# Patient Record
Sex: Male | Born: 1970
Health system: Southern US, Community
[De-identification: ages and names within clinical notes are randomized; demographics above are authoritative.]

## PROBLEM LIST (undated history)

## (undated) DIAGNOSIS — M1990 Unspecified osteoarthritis, unspecified site: Secondary | ICD-10-CM

## (undated) DIAGNOSIS — R7303 Prediabetes: Secondary | ICD-10-CM

## (undated) DIAGNOSIS — F419 Anxiety disorder, unspecified: Secondary | ICD-10-CM

## (undated) DIAGNOSIS — E785 Hyperlipidemia, unspecified: Secondary | ICD-10-CM

## (undated) DIAGNOSIS — K219 Gastro-esophageal reflux disease without esophagitis: Secondary | ICD-10-CM

## (undated) DIAGNOSIS — I10 Essential (primary) hypertension: Secondary | ICD-10-CM

## (undated) HISTORY — DX: Gastro-esophageal reflux disease without esophagitis: K21.9

## (undated) HISTORY — DX: Hyperlipidemia, unspecified: E78.5

## (undated) HISTORY — DX: Essential (primary) hypertension: I10

## (undated) HISTORY — DX: Anxiety disorder, unspecified: F41.9

## (undated) HISTORY — DX: Unspecified osteoarthritis, unspecified site: M19.90

---

## 2004-03-08 ENCOUNTER — Other Ambulatory Visit: Payer: Self-pay

## 2005-04-08 ENCOUNTER — Emergency Department: Payer: Self-pay | Admitting: Emergency Medicine

## 2006-05-03 ENCOUNTER — Ambulatory Visit: Payer: Self-pay | Admitting: Unknown Physician Specialty

## 2006-05-09 ENCOUNTER — Ambulatory Visit: Payer: Self-pay | Admitting: Unknown Physician Specialty

## 2006-07-21 ENCOUNTER — Ambulatory Visit: Payer: Self-pay | Admitting: Psychiatry

## 2007-11-12 ENCOUNTER — Emergency Department: Payer: Self-pay | Admitting: Emergency Medicine

## 2008-04-27 ENCOUNTER — Ambulatory Visit: Payer: Self-pay | Admitting: Family Medicine

## 2008-07-09 DIAGNOSIS — I10 Essential (primary) hypertension: Secondary | ICD-10-CM | POA: Insufficient documentation

## 2008-09-10 DIAGNOSIS — E782 Mixed hyperlipidemia: Secondary | ICD-10-CM | POA: Insufficient documentation

## 2011-04-05 ENCOUNTER — Emergency Department: Payer: Self-pay | Admitting: Unknown Physician Specialty

## 2011-06-12 ENCOUNTER — Emergency Department: Payer: Self-pay | Admitting: Emergency Medicine

## 2014-03-27 ENCOUNTER — Ambulatory Visit: Payer: Self-pay | Admitting: Unknown Physician Specialty

## 2014-08-06 ENCOUNTER — Emergency Department: Payer: Self-pay | Admitting: Emergency Medicine

## 2014-08-06 LAB — CBC
HCT: 46 % (ref 40.0–52.0)
HGB: 15.2 g/dL (ref 13.0–18.0)
MCH: 29.1 pg (ref 26.0–34.0)
MCHC: 32.9 g/dL (ref 32.0–36.0)
MCV: 88 fL (ref 80–100)
Platelet: 267 10*3/uL (ref 150–440)
RBC: 5.22 10*6/uL (ref 4.40–5.90)
RDW: 13.5 % (ref 11.5–14.5)
WBC: 11.8 10*3/uL — AB (ref 3.8–10.6)

## 2014-08-06 LAB — BASIC METABOLIC PANEL
Anion Gap: 10 (ref 7–16)
BUN: 15 mg/dL (ref 7–18)
CHLORIDE: 107 mmol/L (ref 98–107)
CREATININE: 0.92 mg/dL (ref 0.60–1.30)
Calcium, Total: 8.8 mg/dL (ref 8.5–10.1)
Co2: 24 mmol/L (ref 21–32)
EGFR (Non-African Amer.): >60
Glucose: 92 mg/dL (ref 65–99)
OSMOLALITY: 282 (ref 275–301)
POTASSIUM: 3.7 mmol/L (ref 3.5–5.1)
Sodium: 141 mmol/L (ref 136–145)

## 2014-08-06 LAB — TROPONIN I: Troponin-I: <0.02 ng/mL

## 2014-11-25 ENCOUNTER — Emergency Department: Payer: Self-pay | Admitting: Emergency Medicine

## 2014-12-01 ENCOUNTER — Ambulatory Visit: Payer: Self-pay | Admitting: Unknown Physician Specialty

## 2014-12-02 ENCOUNTER — Emergency Department: Payer: Self-pay | Admitting: Emergency Medicine

## 2014-12-03 DIAGNOSIS — F411 Generalized anxiety disorder: Secondary | ICD-10-CM | POA: Insufficient documentation

## 2014-12-08 ENCOUNTER — Emergency Department (HOSPITAL_COMMUNITY)
Admission: EM | Admit: 2014-12-08 | Discharge: 2014-12-08 | Disposition: A | Discharge status: Home / Self Care | Payer: PRIVATE HEALTH INSURANCE | Attending: Emergency Medicine | Admitting: Emergency Medicine

## 2014-12-08 ENCOUNTER — Emergency Department (HOSPITAL_COMMUNITY): Payer: PRIVATE HEALTH INSURANCE

## 2014-12-08 DIAGNOSIS — R5383 Other fatigue: Secondary | ICD-10-CM | POA: Insufficient documentation

## 2014-12-08 DIAGNOSIS — R112 Nausea with vomiting, unspecified: Secondary | ICD-10-CM | POA: Insufficient documentation

## 2014-12-08 DIAGNOSIS — R072 Precordial pain: Secondary | ICD-10-CM | POA: Insufficient documentation

## 2014-12-08 DIAGNOSIS — R079 Chest pain, unspecified: Secondary | ICD-10-CM | POA: Diagnosis present

## 2014-12-08 DIAGNOSIS — R002 Palpitations: Secondary | ICD-10-CM | POA: Insufficient documentation

## 2014-12-08 LAB — CBC
HCT: 48.9 % (ref 39.0–52.0)
Hemoglobin: 16.9 g/dL (ref 13.0–17.0)
MCH: 29.9 pg (ref 26.0–34.0)
MCHC: 34.6 g/dL (ref 30.0–36.0)
MCV: 86.5 fL (ref 78.0–100.0)
Platelets: 300 10*3/uL (ref 150–400)
RBC: 5.65 MIL/uL (ref 4.22–5.81)
RDW: 13.4 % (ref 11.5–15.5)
WBC: 10.1 10*3/uL (ref 4.0–10.5)

## 2014-12-08 LAB — I-STAT TROPONIN, ED: Troponin i, poc: 0 ng/mL (ref 0.00–0.08)

## 2014-12-08 LAB — BASIC METABOLIC PANEL
Anion gap: 8 (ref 5–15)
BUN: 12 mg/dL (ref 6–23)
CO2: 26 mmol/L (ref 19–32)
CREATININE: 1.03 mg/dL (ref 0.50–1.35)
Calcium: 9.5 mg/dL (ref 8.4–10.5)
Chloride: 106 mmol/L (ref 96–112)
GFR calc non Af Amer: 86 mL/min — ABNORMAL LOW (ref >90)
GLUCOSE: 82 mg/dL (ref 70–99)
POTASSIUM: 4.1 mmol/L (ref 3.5–5.1)
Sodium: 140 mmol/L (ref 135–145)

## 2014-12-08 LAB — TSH: TSH: 2.493 u[IU]/mL (ref 0.350–4.500)

## 2014-12-08 MED ORDER — GI COCKTAIL ~~LOC~~
30.0000 mL | Freq: Once | ORAL | Status: DC
  Filled 2014-12-08: qty 30

## 2014-12-08 NOTE — ED Notes (Signed)
Pt verbailized understandintg of d/c instructions and follow up with GI on Thursday, no further questions.

## 2014-12-08 NOTE — ED Notes (Signed)
PA in Room

## 2014-12-08 NOTE — ED Provider Notes (Signed)
CSN: [id number]     Arrival date & time 12/08/14  1243 History   First MD Initiated Contact with Patient 12/08/14 1303     Chief Complaint  Patient presents with  . Chest Pain     (Consider location/radiation/quality/duration/timing/severity/associated sxs/prior Treatment) Patient is a 44 y.o. male presenting with chest pain. The history is provided by the patient and medical records.  Chest Pain Associated symptoms: fatigue, nausea, palpitations and vomiting     This is a 44 year old male with no significant past medical history presenting to the ED for 3 months of ongoing nausea, vomiting, chest pain, palpitations, and sensation of coldness. Patient states over the past 2 weeks he has been evaluated twice at [facility] in [place]. He states he has had numerous blood tests, abdominal ultrasound, CT of his abdomen and pelvis, chest x-rays, HIDA scan-- all without known cause of his symptoms. He states he was started on medications for GERD, but has not taken them today. He notes some weight loss which she thinks is secondary to his persistent nausea and vomiting.  Also notes some fatigue, states "energy is not where it needs to be".  He feels that he has a hormone imbalance and would like this checked.  Patient has no known cardiac hx.  His father had CAD.  He is a daily smoker, 1PPD for the past 15 years.  VSS on arrival.  No past medical history on file. No past surgical history on file. No family history on file. History  Substance Use Topics  . Smoking status: Not on file  . Smokeless tobacco: Not on file  . Alcohol Use: Not on file    Review of Systems  Constitutional: Positive for fatigue.  Cardiovascular: Positive for chest pain and palpitations.  Gastrointestinal: Positive for nausea and vomiting.  All other systems reviewed and are negative.     Allergies  Codeine and Other  Home Medications   Prior to Admission medications    Not on File   BP 124/43 mmHg  Pulse 54  Temp(Src) 97.8 F (36.6 C) (Oral)  Resp 15  Ht  (1.727 m)  Wt 218 lb (98.884 kg)  BMI 33.15 kg/m2  SpO2 99%   Physical Exam  Constitutional: He is oriented to person, place, and time. He appears well-developed and well-nourished. No distress.  Appears well nourished, not cachectic  HENT:  Head: Normocephalic and atraumatic.  Mouth/Throat: Oropharynx is clear and moist.  Eyes: Conjunctivae and EOM are normal. Pupils are equal, round, and reactive to light.  Neck: Normal range of motion. Neck supple.  Cardiovascular: Normal rate, regular rhythm and normal heart sounds.   Pulmonary/Chest: Effort normal and breath sounds normal. No respiratory distress. He has no wheezes.  Tenderness of mid-sternal and sub-sternal regions; no deformities noted; lungs clear bilaterally  Abdominal: Soft. Bowel sounds are normal. There is no tenderness. There is no guarding.  Musculoskeletal: Normal range of motion. He exhibits no edema.  Neurological: He is alert and oriented to person, place, and time.  Skin: Skin is warm and dry. He is not diaphoretic.  Psychiatric: He has a normal mood and affect.  Nursing note and vitals reviewed.   ED Course  Procedures (including critical care time) Labs Review Labs Reviewed  BASIC METABOLIC PANEL - Abnormal; Notable for the following:    GFR calc non Af Amer 86 (*)    All other components within normal limits  CBC  TSH  I-STAT TROPOININ, ED  Imaging Review Dg Chest 2 View  12/08/2014   CLINICAL DATA:  Chest pain  EXAM: CHEST  2 VIEW  COMPARISON:  11/25/2014  FINDINGS: The heart size and mediastinal contours are within normal limits. Both lungs are clear. The visualized skeletal structures are unremarkable.  IMPRESSION: No active cardiopulmonary disease.   Electronically Signed   By: Alcide CleverMark  Lukens M.D.   On: 12/08/2014 14:40     EKG Interpretation None      MDM   Final diagnoses:  Chest pain,  unspecified chest pain type   44 year old male with 3 months of ongoing chest pain, palpitations, nausea, and vomiting, as well as feeling cold. This is his third ED evaluation in the past 2 weeks, previous evaluations were [facility]. He remains concerned that we are "missing something".  On exam, patient is in no acute distress. He does have noted tenderness of his midsternal and substernal chest without noted deformity. His lungs are clear and he is in no respiratory distress. EKG NSR without acute ischemia.  Will obtain labs including CBC, BMP, TSH, troponin as well as CXR.  GI cocktail ordered however patient declined.  Labwork here including TSH is normal. Chest x-ray is clear. I have requested records from [facility] and reviewed them-- CT, abdominal u/s, HIDA scan, and remainder of lab work is WNL.  I have discussed with patient that i have low suspicion for ACS, PE, dissection, or acute cardiac event given negative work-up today after 1 month of ongoing symptoms. He is dissatisfied that we have been unable to find the answer to his pain. He states "I'm worried about my adrenals". I have discussed with him that his adrenal glands appear normal on CT and there were no noted masses. He is concerned about hormone testing, feel this can be done as an OP as will not change management from the ED today.  He has an upcoming appointment with GI in 2 days-- encouraged to keep this appt as he may need EGD.  Discussed plan with patient, he/she acknowledged understanding and agreed with plan of care.  Return precautions given for new or worsening symptoms.  Garlon HatchetLisa M Leeanne Butters, PA-C 12/08/14 1536  Geoffery Lyonsouglas Delo, MD 12/08/14 [phone]

## 2014-12-08 NOTE — ED Notes (Signed)
Onset 3 months ago nausea emesis chest pain "fluttering" feeling cold all the time. Currently pain 4/10 burning with nausea.

## 2014-12-08 NOTE — Discharge Instructions (Signed)
Follow-up with GI on Thursday as scheduled. Return to the ED for new or worsening symptoms.

## 2014-12-08 NOTE — ED Notes (Signed)
Pt back from X-ray.  

## 2014-12-11 ENCOUNTER — Ambulatory Visit: Payer: Self-pay | Admitting: Unknown Physician Specialty

## 2015-02-01 LAB — SURGICAL PATHOLOGY

## 2015-03-15 ENCOUNTER — Other Ambulatory Visit: Payer: Self-pay | Admitting: Family Medicine

## 2015-04-08 ENCOUNTER — Other Ambulatory Visit: Payer: Self-pay

## 2015-04-08 DIAGNOSIS — K30 Functional dyspepsia: Secondary | ICD-10-CM | POA: Insufficient documentation

## 2015-04-08 DIAGNOSIS — E782 Mixed hyperlipidemia: Secondary | ICD-10-CM | POA: Insufficient documentation

## 2015-04-08 DIAGNOSIS — K760 Fatty (change of) liver, not elsewhere classified: Secondary | ICD-10-CM | POA: Insufficient documentation

## 2015-04-08 DIAGNOSIS — K219 Gastro-esophageal reflux disease without esophagitis: Secondary | ICD-10-CM | POA: Insufficient documentation

## 2015-04-08 DIAGNOSIS — F410 Panic disorder [episodic paroxysmal anxiety] without agoraphobia: Secondary | ICD-10-CM | POA: Insufficient documentation

## 2015-04-09 ENCOUNTER — Ambulatory Visit: Payer: Self-pay | Admitting: Family Medicine

## 2015-04-09 ENCOUNTER — Encounter: Payer: Self-pay | Admitting: Family Medicine

## 2015-04-09 ENCOUNTER — Ambulatory Visit (INDEPENDENT_AMBULATORY_CARE_PROVIDER_SITE_OTHER): Payer: PRIVATE HEALTH INSURANCE | Admitting: Family Medicine

## 2015-04-09 VITALS — BP 126/72 | HR 80 | Temp 98.0°F | Resp 16 | Ht 68.0 in | Wt 232.0 lb

## 2015-04-09 DIAGNOSIS — M25512 Pain in left shoulder: Secondary | ICD-10-CM

## 2015-04-09 DIAGNOSIS — M25511 Pain in right shoulder: Secondary | ICD-10-CM

## 2015-04-09 DIAGNOSIS — F411 Generalized anxiety disorder: Secondary | ICD-10-CM

## 2015-04-09 DIAGNOSIS — K589 Irritable bowel syndrome without diarrhea: Secondary | ICD-10-CM | POA: Insufficient documentation

## 2015-04-09 DIAGNOSIS — E669 Obesity, unspecified: Secondary | ICD-10-CM | POA: Insufficient documentation

## 2015-04-09 DIAGNOSIS — K219 Gastro-esophageal reflux disease without esophagitis: Secondary | ICD-10-CM | POA: Insufficient documentation

## 2015-04-09 DIAGNOSIS — R5383 Other fatigue: Secondary | ICD-10-CM | POA: Diagnosis not present

## 2015-04-09 DIAGNOSIS — J45909 Unspecified asthma, uncomplicated: Secondary | ICD-10-CM | POA: Insufficient documentation

## 2015-04-09 DIAGNOSIS — R748 Abnormal levels of other serum enzymes: Secondary | ICD-10-CM | POA: Insufficient documentation

## 2015-04-09 DIAGNOSIS — E785 Hyperlipidemia, unspecified: Secondary | ICD-10-CM | POA: Insufficient documentation

## 2015-04-09 MED ORDER — AMITRIPTYLINE HCL 10 MG PO TABS: ORAL_TABLET | ORAL | Status: DC

## 2015-04-09 NOTE — Progress Notes (Deleted)
Subjective:     Patient ID: James Henry, male   DOB: 1971-09-01, 44 y.o.   MRN: [id number]  HPI   Review of Systems     Objective:   Physical Exam     Assessment:     ***    Plan:     ***

## 2015-04-09 NOTE — Progress Notes (Signed)
Subjective:    Patient ID: James BlalockChristopher S Jeon, male    DOB: 05-Dec-1970, 44 y.o.   MRN: [id number]  HPI This 44 year old male complains of fatigue, shortness of breath (on exertion) and shoulder discomfort/burning or sting over the past 2 months. Shoulders worse with caffeine intake. Some lightheaded sensation to tip head to either side in the mornings. Denies nausea but some IBS diagnosed by gastroenterologist (upper and lower endoscopy) and gallbladder tests. Fatigue and shoulder pain helped by taking a nap. Increase in shoulder discomfort with physical activities and some tightness to reach behind back. 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 Has an appointment to have his orthopedist evaluate shoulders next week. Past Medical History  Diagnosis Date  . GERD (gastroesophageal reflux disease)   . Anxiety   . Hyperlipidemia   . Hypertension    No past surgical history on file. History  Substance Use Topics  . Smoking status: Never Smoker   . Smokeless tobacco: Current User    Types: Snuff  . Alcohol Use: 0.0 oz/week    0 Standard drinks or equivalent per week     Comment: MODERATE USE- 21 BEERS EACH WEEKEND   Family History  Problem Relation Age of Onset  . Coronary artery disease Father   . Cancer Father   . Depression Brother   . Heart attack Maternal Grandmother    Current Outpatient Prescriptions on File Prior to Visit  Medication Sig Dispense Refill  . amitriptyline (ELAVIL) 10 MG tablet TAKE ONE TABLET BY MOUTH AT BEDTIME 30 tablet 3  . metoprolol succinate (TOPROL-XL) 100 MG 24 hr tablet Take 100 mg by mouth daily. Take with or immediately following a meal.    . ranitidine (ZANTAC) 150 MG tablet Take 150 mg by mouth daily as needed for heartburn.    . sucralfate (CARAFATE) 1 G tablet Take 1 tablet by mouth. 4 TIMES DAILY BEFORE MEALS    . citalopram (CELEXA) 20 MG tablet Take 20 mg by mouth daily.    .  hydrochlorothiazide (HYDRODIURIL) 12.5 MG tablet Take 1 tablet by mouth daily.    . hydrOXYzine (ATARAX/VISTARIL) 25 MG tablet Take 25 mg by mouth at bedtime as needed (sleep).    . hydrOXYzine (VISTARIL) 25 MG capsule Take 1 capsule by mouth. 1-2 TIMES A DAY FOR SLEEP OR ANXIETY    . lansoprazole (PREVACID) 30 MG capsule Take 1 capsule by mouth daily.    Marland Kitchen. omega-3 acid ethyl esters (LOVAZA) 1 G capsule Take 1 g by mouth 2 (two) times daily.    Marland Kitchen. omeprazole (PRILOSEC) 40 MG capsule Take 40 mg by mouth 2 (two) times daily.     No current facility-administered medications on file prior to visit.   Allergies  Allergen Reactions  . Codeine   . Iodinated Diagnostic Agents Hives  . Other     MRI DYE     Review of Systems  Constitutional: Positive for fatigue. Negative for diaphoresis and appetite change.  HENT: Negative.   Eyes: Negative.   Respiratory: Positive for chest tightness and shortness of breath.   Gastrointestinal: Positive for abdominal pain and diarrhea.       Occasional with history of IBS  Endocrine: Negative for cold intolerance, heat intolerance, polydipsia and polyuria.  Genitourinary: Negative.   Musculoskeletal: Positive for arthralgias.  Psychiatric/Behavioral: Negative for confusion, sleep disturbance and decreased concentration. The patient is nervous/anxious.  BP 126/72 mmHg  Pulse 80  Temp(Src) 98 F (36.7 C)  Resp 16  Ht  (1.727 m)  Wt 232 lb (105.235 kg)  BMI 35.28 kg/m2  Objective:   Physical Exam  Constitutional: He is oriented to person, place, and time. He appears well-developed and well-nourished.  HENT:  Head: Atraumatic.  Right Ear: External ear normal.  Left Ear: External ear normal.  Nose: Nose normal.  Mouth/Throat: Oropharynx is clear and moist.  Eyes: EOM are normal. Pupils are equal, round, and reactive to light.  Neck: Normal range of motion. Neck supple. No thyromegaly present.  Cardiovascular: Normal rate and regular  rhythm.   Pulmonary/Chest: Effort normal and breath sounds normal.  Abdominal: Soft. Bowel sounds are normal.  Musculoskeletal: He exhibits tenderness.  Soreness to palpate top and anterior shoulders bilaterally. Increased discomfort and pain to test UE strength or reach behind back.  Neurological: He is alert and oriented to person, place, and time. He has normal reflexes.  Skin: Skin is warm and dry.  Psychiatric: His speech is normal. His mood appears anxious. His affect is blunt. He is agitated and aggressive. Cognition and memory are normal. He expresses impulsivity. He expresses no suicidal ideation. He expresses no suicidal plans.      Assessment & Plan:  1. Other fatigue Onset over the past 2 months. No specific illness or trauma. Tired with exertion and some shortness of breath. Cardiologist is planning stress test soon. Will check labs for dehydration, infection or metabolic disorder. Increase fluid intake and recheck pending reports. - CBC with Differential/Platelet - Comprehensive metabolic panel  2. 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 Suspect severe strain of shoulders. Will be evaluated by his orthopedist next week. Recommend anti-inflammatory of choice and recheck prn.  - C-reactive protein  3. Generalized anxiety disorder Felt better with use of the Elavil for a month back in March 2016. Has had anxiety, irritability, fatigue and general malaise over the past 2 months. Will refill Elavil and increase dosage to 20 mg daily. May need referral to psychiatrist pending recheck 04-22-15 at [facility]. - amitriptyline (ELAVIL) 10 MG tablet; Take 1-2 tablets by mouth at bedtime  Dispense: 30 tablet; Refill: 6

## 2015-04-17 LAB — CBC WITH DIFFERENTIAL/PLATELET
BASOS ABS: 0 10*3/uL (ref 0.0–0.2)
Basos: 0 %
EOS (ABSOLUTE): 0.2 10*3/uL (ref 0.0–0.4)
EOS: 2 %
HEMATOCRIT: 49.1 % (ref 37.5–51.0)
HEMOGLOBIN: 17.1 g/dL (ref 12.6–17.7)
Immature Grans (Abs): 0 10*3/uL (ref 0.0–0.1)
Immature Granulocytes: 0 %
LYMPHS: 31 %
Lymphocytes Absolute: 3.8 10*3/uL — ABNORMAL HIGH (ref 0.7–3.1)
MCH: 30 pg (ref 26.6–33.0)
MCHC: 34.8 g/dL (ref 31.5–35.7)
MCV: 86 fL (ref 79–97)
MONOCYTES: 9 %
Monocytes Absolute: 1 10*3/uL — ABNORMAL HIGH (ref 0.1–0.9)
NEUTROS ABS: 7.1 10*3/uL — AB (ref 1.4–7.0)
Neutrophils: 58 %
Platelets: 309 10*3/uL (ref 150–379)
RBC: 5.7 x10E6/uL (ref 4.14–5.80)
RDW: 13.5 % (ref 12.3–15.4)
WBC: 12.1 10*3/uL — AB (ref 3.4–10.8)

## 2015-04-17 LAB — COMPREHENSIVE METABOLIC PANEL
ALBUMIN: 4.7 g/dL (ref 3.5–5.5)
ALT: 29 IU/L (ref 0–44)
AST: 17 IU/L (ref 0–40)
Albumin/Globulin Ratio: 1.9 (ref 1.1–2.5)
Alkaline Phosphatase: 71 IU/L (ref 39–117)
BUN/Creatinine Ratio: 15 (ref 9–20)
BUN: 13 mg/dL (ref 6–24)
Bilirubin Total: 0.4 mg/dL (ref 0.0–1.2)
CO2: 17 mmol/L — ABNORMAL LOW (ref 18–29)
CREATININE: 0.89 mg/dL (ref 0.76–1.27)
Calcium: 9.4 mg/dL (ref 8.7–10.2)
Chloride: 103 mmol/L (ref 97–108)
GFR calc Af Amer: 119 mL/min/{1.73_m2} (ref >59)
GFR, EST NON AFRICAN AMERICAN: 103 mL/min/{1.73_m2} (ref >59)
GLUCOSE: 85 mg/dL (ref 65–99)
Globulin, Total: 2.5 g/dL (ref 1.5–4.5)
Potassium: 3.9 mmol/L (ref 3.5–5.2)
Sodium: 140 mmol/L (ref 134–144)
TOTAL PROTEIN: 7.2 g/dL (ref 6.0–8.5)

## 2015-04-17 LAB — C-REACTIVE PROTEIN: CRP: 1.9 mg/L (ref 0.0–4.9)

## 2015-04-19 ENCOUNTER — Telehealth: Payer: Self-pay

## 2015-04-19 DIAGNOSIS — D72829 Elevated white blood cell count, unspecified: Secondary | ICD-10-CM

## 2015-04-19 MED ORDER — DOXYCYCLINE HYCLATE 100 MG PO TABS: 100.0000 mg | ORAL_TABLET | Freq: Two times a day (BID) | ORAL | Status: DC

## 2015-04-19 NOTE — Telephone Encounter (Signed)
-----   Message from Tamsen Roersennis E Chrismon, [place] sent at 04/19/2015  9:16 AM EDT ----- Blood tests essentially normal with slight elevation of WBC count. Has he possibly had any fevers or tick bites recently? If so, he will need Doxycycline 100 mg BID #20. Proceed with orthopedic evaluation of shoulders and finish stress testing by cardiologist.

## 2015-04-19 NOTE — Telephone Encounter (Signed)
Patient advised as directed below. Patient verbalized understanding and agrees with treatment plan. Patient states he had a Ortho appointment on Wednesday, and he did not know if he was going to finish the stress testing by cardiologist. Maurine Ministerennis is aware.  RX sent to pharmacy. Patient is aware.

## 2015-05-04 ENCOUNTER — Other Ambulatory Visit: Payer: Self-pay | Admitting: Family Medicine

## 2015-05-04 ENCOUNTER — Encounter: Payer: Self-pay | Admitting: Emergency Medicine

## 2015-05-04 ENCOUNTER — Emergency Department
Admission: EM | Admit: 2015-05-04 | Discharge: 2015-05-04 | Disposition: A | Discharge status: Home / Self Care | Payer: PRIVATE HEALTH INSURANCE | Attending: Emergency Medicine | Admitting: Emergency Medicine

## 2015-05-04 DIAGNOSIS — I10 Essential (primary) hypertension: Secondary | ICD-10-CM | POA: Diagnosis not present

## 2015-05-04 DIAGNOSIS — Z87891 Personal history of nicotine dependence: Secondary | ICD-10-CM | POA: Diagnosis not present

## 2015-05-04 DIAGNOSIS — Z792 Long term (current) use of antibiotics: Secondary | ICD-10-CM | POA: Insufficient documentation

## 2015-05-04 DIAGNOSIS — R202 Paresthesia of skin: Secondary | ICD-10-CM

## 2015-05-04 DIAGNOSIS — Z79899 Other long term (current) drug therapy: Secondary | ICD-10-CM | POA: Diagnosis not present

## 2015-05-04 DIAGNOSIS — R200 Anesthesia of skin: Secondary | ICD-10-CM | POA: Diagnosis present

## 2015-05-04 DIAGNOSIS — Z862 Personal history of diseases of the blood and blood-forming organs and certain disorders involving the immune mechanism: Secondary | ICD-10-CM

## 2015-05-04 NOTE — ED Notes (Signed)
Patient ambulatory to triage with steady gait, without difficulty or distress noted; pt reports numbness to face/back/fingertips x month; st was seen 69yrs ago for same and told possible MS

## 2015-05-04 NOTE — ED Provider Notes (Signed)
[facility] Emergency Department Provider Note  ____________________________________________  Time seen: 0715  I have reviewed the triage vital signs and the nursing notes.   HISTORY  Chief Complaint Numbness   History limited by: Not Limited   HPI James Henry is a 44 y.o. male who presents to the emergency department today because of concerns for bilateral tingling and numbness in his upper extremities, upper back and face.He states that he has been having some of these symptoms for as long as 8 months ago. States that the hand numbness has been only for the past month. He states the symptoms are worse when it is hot outside. He states that he had similar symptoms 15 years ago and saw a neurologist at that time. He said they thought he had MS although they could not find any evidence of it. In addition he has had recent bilateral shoulder pain and has seen an orthopedic doctor who believes he has arthritis. Furthermore the patient is concerned about his white blood cell count of 12,000 at last primary check given that his father had leukemia. Patient denies any recent trauma to his head or his neck. Denies any fevers, chest pain shortness of breath.     Past Medical History  Diagnosis Date  . GERD (gastroesophageal reflux disease)   . Anxiety   . Hyperlipidemia   . Hypertension     Patient Active Problem List   Diagnosis Date Noted  . Airway hyperreactivity 04/09/2015  . Abnormal liver enzymes 04/09/2015  . Acid reflux 04/09/2015  . HLD (hyperlipidemia) 04/09/2015  . Adaptive colitis 04/09/2015  . Adiposity 04/09/2015  . Acid indigestion 04/08/2015  . Esophageal reflux 04/08/2015  . Elevated cholesterol with elevated triglycerides 04/08/2015  . Severe anxiety with panic 04/08/2015  . Fatty infiltration of liver 04/08/2015  . Anxiety, generalized 12/03/2014  . Combined fat and carbohydrate induced hyperlipemia 09/10/2008  . Essential  (primary) hypertension 07/09/2008    History reviewed. No pertinent past surgical history.  Current Outpatient Rx  Name  Route  Sig  Dispense  Refill  . amitriptyline (ELAVIL) 10 MG tablet      Take 1-2 tablets by mouth at bedtime   30 tablet   6   . citalopram (CELEXA) 20 MG tablet   Oral   Take 20 mg by mouth daily.         Marland Kitchen doxycycline (VIBRA-TABS) 100 MG tablet   Oral   Take 1 tablet (100 mg total) by mouth 2 (two) times daily.   20 tablet   0   . hydrochlorothiazide (HYDRODIURIL) 12.5 MG tablet   Oral   Take 1 tablet by mouth daily.         . hydrOXYzine (ATARAX/VISTARIL) 25 MG tablet   Oral   Take 25 mg by mouth at bedtime as needed (sleep).         . hydrOXYzine (VISTARIL) 25 MG capsule   Oral   Take 1 capsule by mouth. 1-2 TIMES A DAY FOR SLEEP OR ANXIETY         . lansoprazole (PREVACID) 30 MG capsule   Oral   Take 1 capsule by mouth daily.         . metoprolol succinate (TOPROL-XL) 100 MG 24 hr tablet   Oral   Take 100 mg by mouth daily. Take with or immediately following a meal.         . omega-3 acid ethyl esters (LOVAZA) 1 G capsule   Oral  Take 1 g by mouth 2 (two) times daily.         Marland Kitchen omeprazole (PRILOSEC) 40 MG capsule   Oral   Take 40 mg by mouth 2 (two) times daily.         . ranitidine (ZANTAC) 150 MG tablet   Oral   Take 150 mg by mouth daily as needed for heartburn.         . sucralfate (CARAFATE) 1 G tablet   Oral   Take 1 tablet by mouth. 4 TIMES DAILY BEFORE MEALS           Allergies Codeine; Iodinated diagnostic agents; and Other  Family History  Problem Relation Age of Onset  . Coronary artery disease Father   . Cancer Father   . Depression Brother   . Heart attack Maternal Grandmother     Social History History  Substance Use Topics  . Smoking status: Former Games developer  . Smokeless tobacco: Current User    Types: Snuff  . Alcohol Use: 0.0 oz/week    0 Standard drinks or equivalent per week      Comment: MODERATE USE- 21 BEERS EACH WEEKEND    Review of Systems  Constitutional: Negative for fever. Cardiovascular: Negative for chest pain. Respiratory: Negative for shortness of breath. Gastrointestinal: Negative for abdominal pain, vomiting and diarrhea. Genitourinary: Negative for dysuria. Musculoskeletal: Negative for back pain. Skin: Negative for rash. Neurological: Negative for headaches, focal weakness. Tingling and numbness to upper back, face and distal upper extremities.   10-point ROS otherwise negative.  ____________________________________________   PHYSICAL EXAM:  VITAL SIGNS: ED Triage Vitals  Enc Vitals Group     BP 05/04/15 0643 148/73 mmHg     Pulse Rate 05/04/15 0643 86     Resp 05/04/15 0643 18     Temp 05/04/15 0643 97.9 F (36.6 C)     Temp Source 05/04/15 0643 Oral     SpO2 05/04/15 0643 99 %     Weight 05/04/15 0643 231 lb (104.781 kg)     Height 05/04/15 0643 5\' 8"  (1.727 m)   Constitutional: Alert and oriented. Well appearing and in no distress. Eyes: Conjunctivae are normal. PERRL. Normal extraocular movements. ENT   Head: Normocephalic and atraumatic.   Nose: No congestion/rhinnorhea.   Mouth/Throat: Mucous membranes are moist.   Neck: No stridor. Hematological/Lymphatic/Immunilogical: No cervical lymphadenopathy. Cardiovascular: Normal rate, regular rhythm.  No murmurs, rubs, or gallops. Respiratory: Normal respiratory effort without tachypnea nor retractions. Breath sounds are clear and equal bilaterally. No wheezes/rales/rhonchi. Genitourinary: Deferred Musculoskeletal: Normal range of motion in all extremities. No joint effusions.  No lower extremity tenderness nor edema. Neurologic:  Normal speech and language. PERRL, extraocular motions intact. No appreciable facial droop. Tongue midline. Symmetric bilateral palate elevation. Sensation intact over face. Strength 5 out of 5 in both upper and lower extremities.  Sensation grossly intact. Only some subjective numbness to the distal upper extremities. Romberg normal. Finger to nose normal. Gait normal. No gross focal neurologic deficits are appreciated. Speech is normal.  Skin:  Skin is warm, dry and intact. No rash noted. Psychiatric: Mood and affect are normal. Speech and behavior are normal. Patient exhibits appropriate insight and judgment.  ____________________________________________    LABS (pertinent positives/negatives)  None  ____________________________________________   EKG  None  ____________________________________________    RADIOLOGY  None  ____________________________________________   PROCEDURES  Procedure(s) performed: None  Critical Care performed: No  ____________________________________________   INITIAL IMPRESSION / ASSESSMENT AND PLAN /  ED COURSE  Pertinent labs & imaging results that were available during my care of the patient were reviewed by me and considered in my medical decision making (see chart for details).  Patient presents to the emergency department today with primary concern of numbness and tingling to his face, upper back, distal upper extremities. On exam patient has full and good strength in both upper and lower extremities. Sensation was intact with only some subjective numbness to the distal upper extremities. The symptoms have been variable and ongoing for roughly 8 months. I discussed with patient importance of following up with neurology. Given physical exam today and longevity of symptoms I do not feel that emergent neuro imaging is warranted. We will discharge patient home with neurology follow-up.  ____________________________________________   FINAL CLINICAL IMPRESSION(S) / ED DIAGNOSES  Final diagnoses:  Paresthesia     Phineas Semen, MD 05/04/15 [phone]

## 2015-05-04 NOTE — ED Notes (Signed)
MD at bedside. 

## 2015-05-04 NOTE — Discharge Instructions (Signed)
Please seek medical attention for any high fevers, chest pain, shortness of breath, change in behavior, persistent vomiting, bloody stool or any other new or concerning symptoms. ° °Paresthesia °Paresthesia is an abnormal burning or prickling sensation. This sensation is generally felt in the hands, arms, legs, or feet. However, it may occur in any part of the body. It is usually not painful. The feeling may be described as: °· Tingling or numbness. °· "Pins and needles." °· Skin crawling. °· Buzzing. °· Limbs "falling asleep." °· Itching. °Most people experience temporary (transient) paresthesia at some time in their lives. °CAUSES  °Paresthesia may occur when you breathe too quickly (hyperventilation). It can also occur without any apparent cause. Commonly, paresthesia occurs when pressure is placed on a nerve. The feeling quickly goes away once the pressure is removed. For some people, however, paresthesia is a long-lasting (chronic) condition caused by an underlying disorder. The underlying disorder may be: °· A traumatic, direct injury to nerves. Examples include a: °¨ Broken (fractured) neck. °¨ Fractured skull. °· A disorder affecting the brain and spinal cord (central nervous system). Examples include: °¨ Transverse myelitis. °¨ Encephalitis. °¨ Transient ischemic attack. °¨ Multiple sclerosis. °¨ Stroke. °¨ Tumor or blood vessel problems, such as an arteriovenous malformation pressing against the brain or spinal cord. °· A condition that damages the peripheral nerves (peripheral neuropathy). Peripheral nerves are not part of the brain and spinal cord. These conditions include: °¨ Diabetes. °¨ Peripheral vascular disease. °¨ Nerve entrapment syndromes, such as carpal tunnel syndrome. °¨ Shingles. °¨ Hypothyroidism. °¨ Vitamin B12 deficiencies. °¨ Alcoholism. °¨ Heavy metal poisoning (lead, arsenic). °¨ Rheumatoid arthritis. °¨ Systemic lupus erythematosus. °DIAGNOSIS  °Your caregiver will attempt to find the  underlying cause of your paresthesia. Your caregiver may: °· Take your medical history. °· Perform a physical exam. °· Order various lab tests. °· Order imaging tests. °TREATMENT  °Treatment for paresthesia depends on the underlying cause. °HOME CARE INSTRUCTIONS °· Avoid drinking alcohol. °· You may consider massage or acupuncture to help relieve your symptoms. °· Keep all follow-up appointments as directed by your caregiver. °SEEK IMMEDIATE MEDICAL CARE IF:  °· You feel weak. °· You have trouble walking or moving. °· You have problems with speech or vision. °· You feel confused. °· You cannot control your bladder or bowel movements. °· You feel numbness after an injury. °· You faint. °· Your burning or prickling feeling gets worse when walking. °· You have pain, cramps, or dizziness. °· You develop a rash. °MAKE SURE YOU: °· Understand these instructions. °· Will watch your condition. °· Will get help right away if you are not doing well or get worse. °Document Released: 09/15/2002 Document Revised: 12/18/2011 Document Reviewed: 06/16/2011 °[employer]® Patient Information ©2015 [employer]. This information is not intended to replace advice given to you by your health care provider. Make sure you discuss any questions you have with your health care provider. ° °

## 2015-05-05 LAB — CBC WITH DIFFERENTIAL/PLATELET
Basophils Absolute: 0 10*3/uL (ref 0.0–0.2)
Basos: 0 %
EOS (ABSOLUTE): 0.1 10*3/uL (ref 0.0–0.4)
EOS: 1 %
HEMOGLOBIN: 16.4 g/dL (ref 12.6–17.7)
Hematocrit: 48.5 % (ref 37.5–51.0)
IMMATURE GRANULOCYTES: 0 %
Immature Grans (Abs): 0 10*3/uL (ref 0.0–0.1)
LYMPHS: 28 %
Lymphocytes Absolute: 2.3 10*3/uL (ref 0.7–3.1)
MCH: 30.3 pg (ref 26.6–33.0)
MCHC: 33.8 g/dL (ref 31.5–35.7)
MCV: 90 fL (ref 79–97)
Monocytes Absolute: 0.6 10*3/uL (ref 0.1–0.9)
Monocytes: 7 %
NEUTROS PCT: 64 %
Neutrophils Absolute: 5.1 10*3/uL (ref 1.4–7.0)
Platelets: 269 10*3/uL (ref 150–379)
RBC: 5.42 x10E6/uL (ref 4.14–5.80)
RDW: 13.4 % (ref 12.3–15.4)
WBC: 8 10*3/uL (ref 3.4–10.8)

## 2015-05-06 ENCOUNTER — Telehealth: Payer: Self-pay

## 2015-05-06 NOTE — Telephone Encounter (Signed)
Left patient a message on cell voicemail advising him that labs were normal.

## 2015-05-06 NOTE — Telephone Encounter (Signed)
-----   Message from Tamsen Roers, [place] sent at 05/06/2015  1:59 PM EDT ----- WBC count back to normal now. No abnormalities.

## 2015-05-13 ENCOUNTER — Other Ambulatory Visit: Payer: Self-pay | Admitting: Family Medicine

## 2015-05-13 DIAGNOSIS — M25571 Pain in right ankle and joints of right foot: Secondary | ICD-10-CM

## 2015-05-13 DIAGNOSIS — M25572 Pain in left ankle and joints of left foot: Secondary | ICD-10-CM

## 2015-05-13 DIAGNOSIS — F411 Generalized anxiety disorder: Secondary | ICD-10-CM

## 2015-05-13 MED ORDER — AMITRIPTYLINE HCL 10 MG PO TABS: ORAL_TABLET | ORAL | Status: DC

## 2015-05-14 ENCOUNTER — Other Ambulatory Visit: Payer: Self-pay

## 2015-05-14 DIAGNOSIS — M25572 Pain in left ankle and joints of left foot: Secondary | ICD-10-CM

## 2015-05-14 DIAGNOSIS — M25571 Pain in right ankle and joints of right foot: Secondary | ICD-10-CM

## 2015-06-17 DIAGNOSIS — R1319 Other dysphagia: Secondary | ICD-10-CM | POA: Insufficient documentation

## 2015-06-17 DIAGNOSIS — M542 Cervicalgia: Secondary | ICD-10-CM

## 2015-06-17 DIAGNOSIS — G8929 Other chronic pain: Secondary | ICD-10-CM | POA: Insufficient documentation

## 2015-06-23 ENCOUNTER — Other Ambulatory Visit: Payer: Self-pay | Admitting: Neurology

## 2015-06-23 DIAGNOSIS — M542 Cervicalgia: Secondary | ICD-10-CM

## 2015-06-23 DIAGNOSIS — R200 Anesthesia of skin: Secondary | ICD-10-CM

## 2015-06-23 DIAGNOSIS — M25511 Pain in right shoulder: Secondary | ICD-10-CM

## 2015-06-23 DIAGNOSIS — G8929 Other chronic pain: Secondary | ICD-10-CM

## 2015-06-23 DIAGNOSIS — R1310 Dysphagia, unspecified: Secondary | ICD-10-CM

## 2015-07-03 ENCOUNTER — Ambulatory Visit
Admission: RE | Admit: 2015-07-03 | Discharge: 2015-07-03 | Disposition: A | Discharge status: Home / Self Care | Payer: PRIVATE HEALTH INSURANCE | Source: Ambulatory Visit | Attending: Neurology | Admitting: Neurology

## 2015-07-03 ENCOUNTER — Ambulatory Visit: Payer: PRIVATE HEALTH INSURANCE

## 2015-07-03 DIAGNOSIS — R200 Anesthesia of skin: Secondary | ICD-10-CM | POA: Diagnosis not present

## 2015-07-03 DIAGNOSIS — M5022 Other cervical disc displacement, mid-cervical region: Secondary | ICD-10-CM | POA: Diagnosis not present

## 2015-07-03 DIAGNOSIS — R1310 Dysphagia, unspecified: Secondary | ICD-10-CM

## 2015-07-03 DIAGNOSIS — M47812 Spondylosis without myelopathy or radiculopathy, cervical region: Secondary | ICD-10-CM | POA: Diagnosis not present

## 2015-07-03 DIAGNOSIS — M542 Cervicalgia: Secondary | ICD-10-CM

## 2015-07-03 DIAGNOSIS — G8929 Other chronic pain: Secondary | ICD-10-CM

## 2015-07-03 DIAGNOSIS — M25511 Pain in right shoulder: Secondary | ICD-10-CM

## 2015-07-19 ENCOUNTER — Other Ambulatory Visit: Payer: Self-pay | Admitting: Neurology

## 2015-07-19 DIAGNOSIS — R937 Abnormal findings on diagnostic imaging of other parts of musculoskeletal system: Secondary | ICD-10-CM

## 2015-07-19 DIAGNOSIS — R200 Anesthesia of skin: Secondary | ICD-10-CM

## 2015-07-19 DIAGNOSIS — M542 Cervicalgia: Secondary | ICD-10-CM

## 2015-07-19 DIAGNOSIS — R1319 Other dysphagia: Secondary | ICD-10-CM

## 2015-07-30 ENCOUNTER — Other Ambulatory Visit: Payer: PRIVATE HEALTH INSURANCE

## 2015-08-05 ENCOUNTER — Ambulatory Visit
Admission: RE | Admit: 2015-08-05 | Discharge: 2015-08-05 | Disposition: A | Discharge status: Home / Self Care | Payer: PRIVATE HEALTH INSURANCE | Source: Ambulatory Visit | Attending: Neurology | Admitting: Neurology

## 2015-08-05 DIAGNOSIS — R200 Anesthesia of skin: Secondary | ICD-10-CM | POA: Insufficient documentation

## 2015-08-05 DIAGNOSIS — R1319 Other dysphagia: Secondary | ICD-10-CM | POA: Diagnosis not present

## 2015-08-05 DIAGNOSIS — M542 Cervicalgia: Secondary | ICD-10-CM | POA: Diagnosis present

## 2015-08-05 DIAGNOSIS — Z09 Encounter for follow-up examination after completed treatment for conditions other than malignant neoplasm: Secondary | ICD-10-CM | POA: Insufficient documentation

## 2015-08-05 DIAGNOSIS — R937 Abnormal findings on diagnostic imaging of other parts of musculoskeletal system: Secondary | ICD-10-CM | POA: Diagnosis present

## 2015-08-05 DIAGNOSIS — I672 Cerebral atherosclerosis: Secondary | ICD-10-CM | POA: Diagnosis not present

## 2016-02-21 IMAGING — MR MR MRA HEAD W/O CM
1 series · 23 of 48 positions shown · non-contrast
Comparison: MRI brain 07/03/2015.

CLINICAL DATA: Abnormal focus of chronic hemorrhage in the LEFT
temporal lobe. Exclude LEFT MCA aneurysm.

EXAM:
MRA HEAD WITHOUT CONTRAST
TECHNIQUE: Angiographic images of the Circle of Willis were obtained using MRA
technique without intravenous contrast.

[Series 3: TOF · axial · non-contrast · 0.7mm · 0.37mm/px · z∈[-95,+37]mm · 23 of 200 slices shown]
[im 1/200]
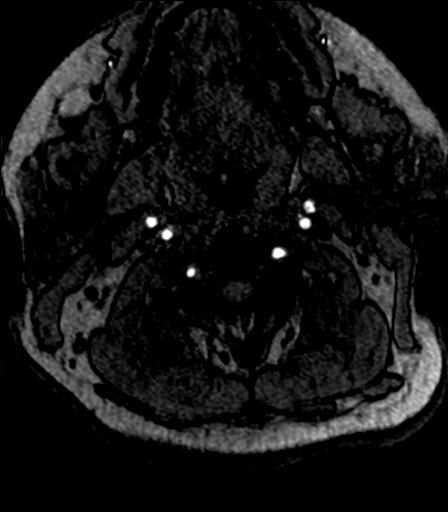
[im 5/200]
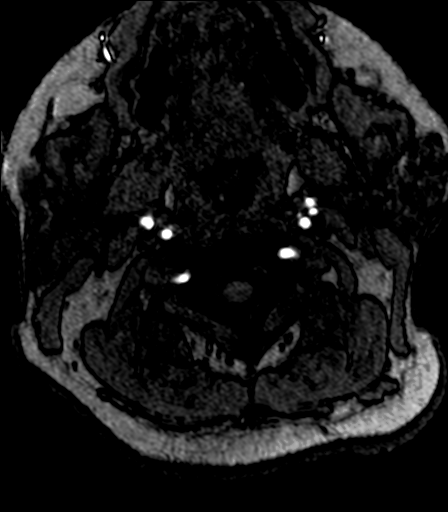
[im 9/200]
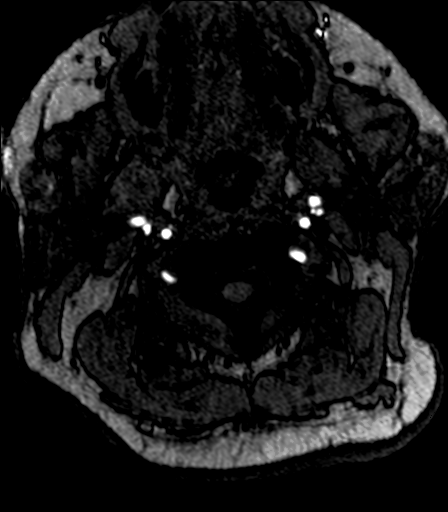
[im 13/200]
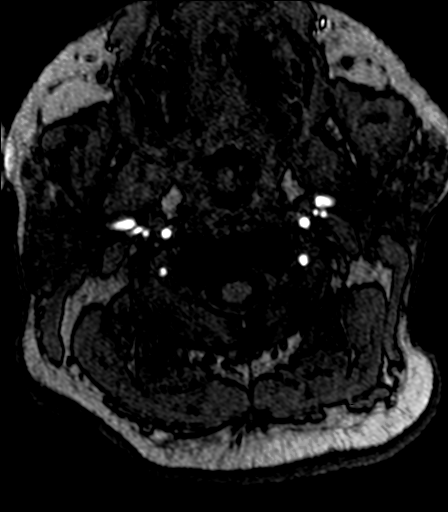
[im 17/200]
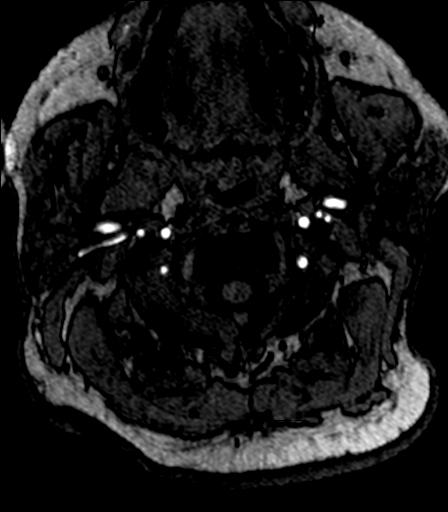
[im 22/200]
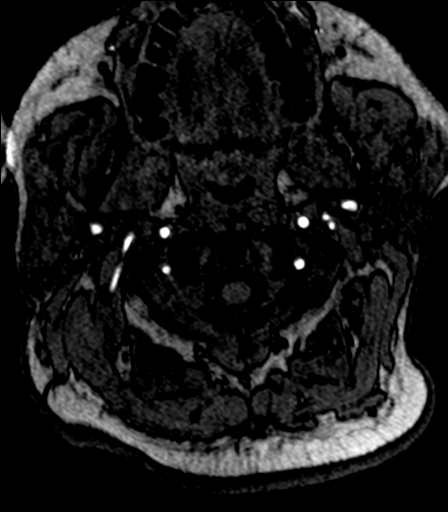
[im 26/200]
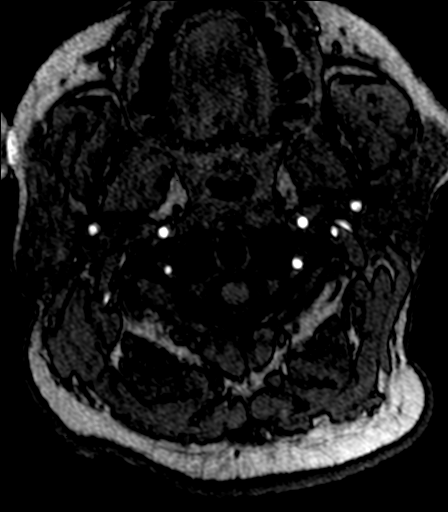
[im 30/200]
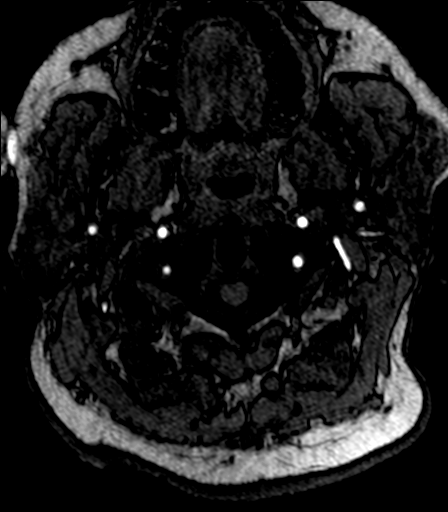
[im 34/200]
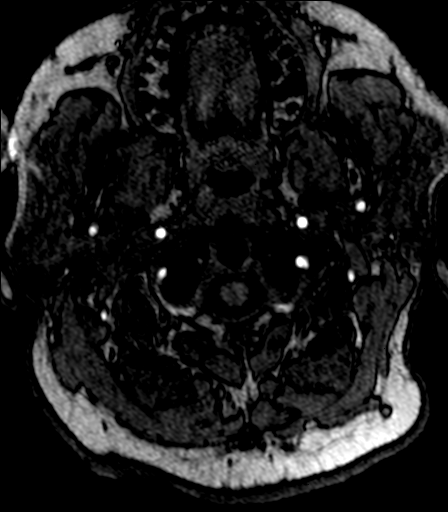
[im 39/200]
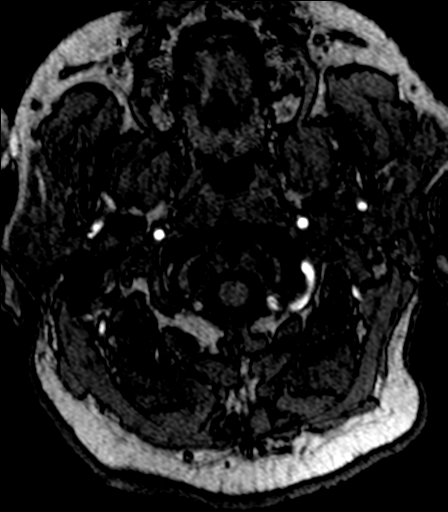
[im 43/200]
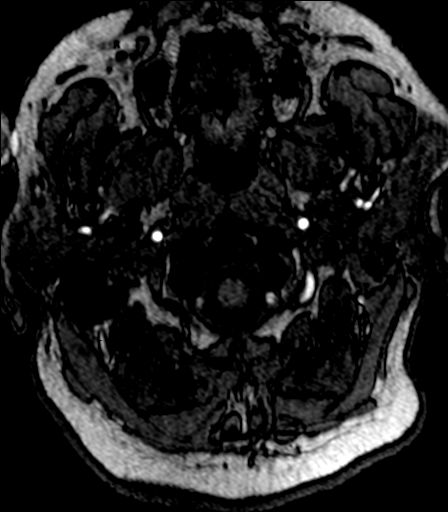
[im 47/200]
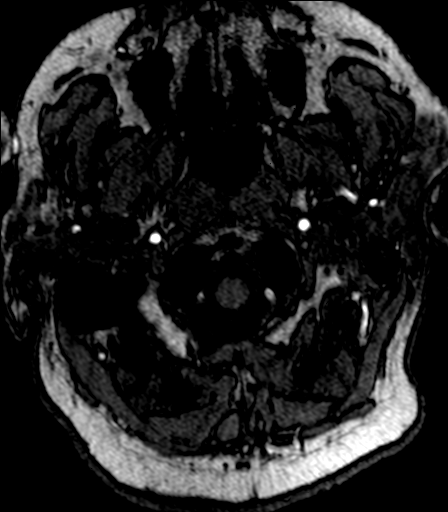
[im 51/200]
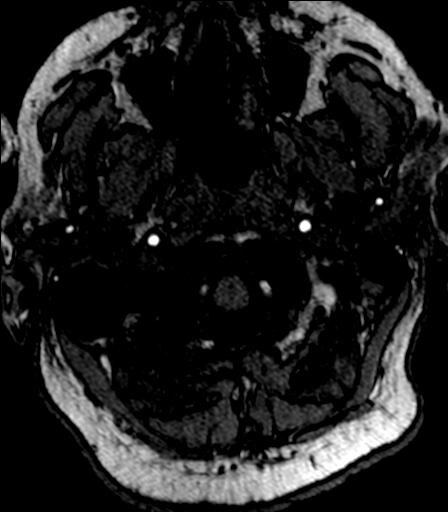
[im 56/200]
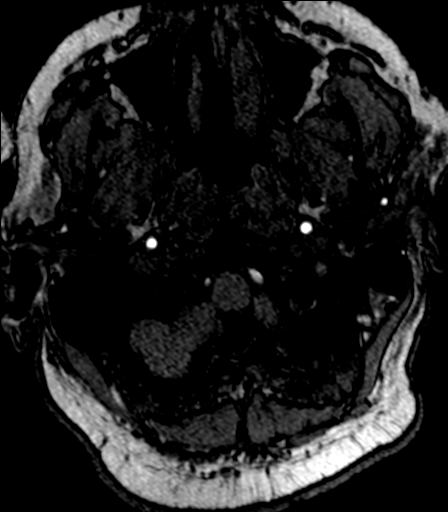
[im 60/200]
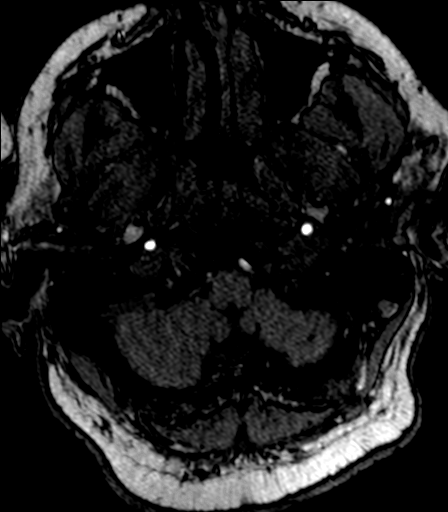
[im 64/200]
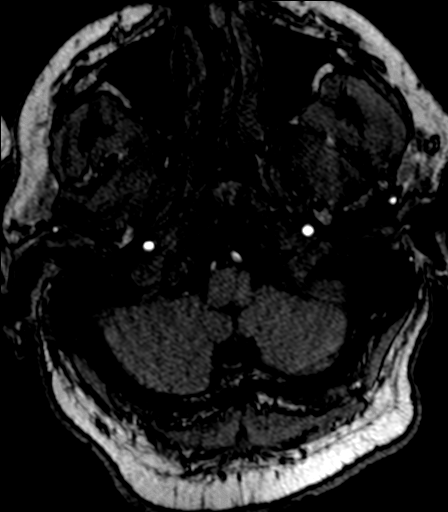
[im 89/200]
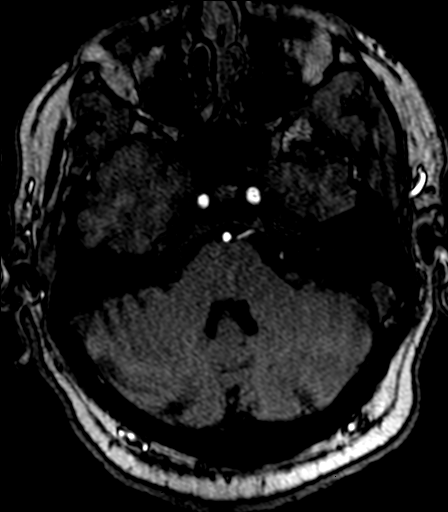
[im 102/200]
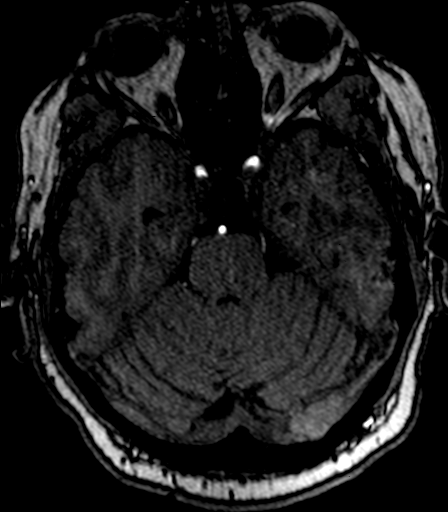
[im 115/200]
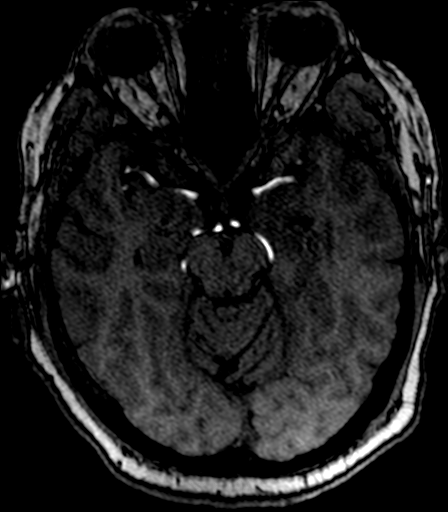
[im 140/200]
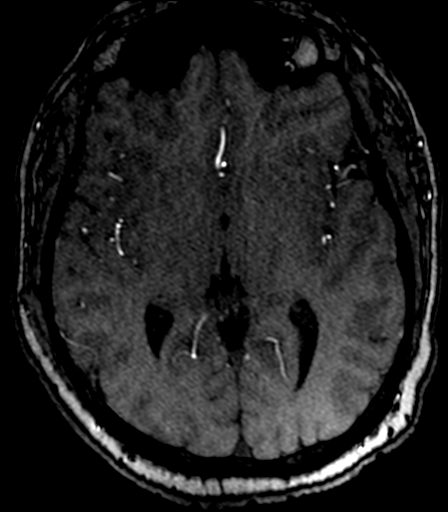
[im 166/200]
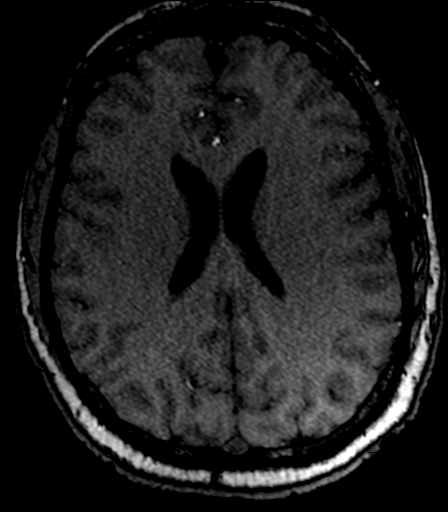
[im 170/200]
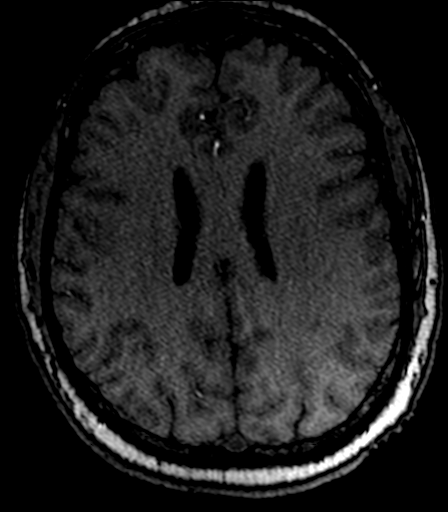
[im 191/200]
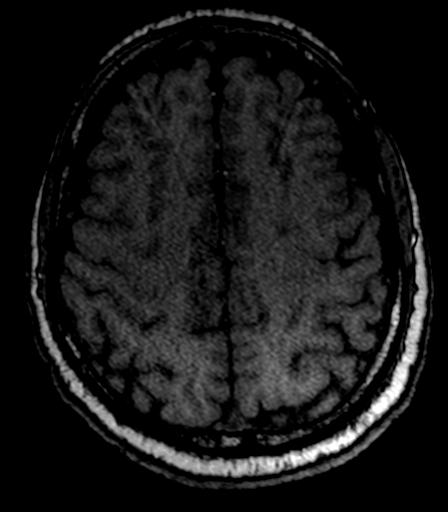

[23 of 48 positions shown; findings below may reference images not displayed]

FINDINGS: The internal carotid arteries are widely patent. The basilar artery
is widely patent with LEFT vertebral dominant. RIGHT vertebral
becomes diminutive distal to the RIGHT height the origin although
superimposed atheromatous change not excluded in the distal V4
segment.

No flow reducing lesion in the RIGHT or LEFT M1 MCA.

Mildly irregular RIGHT A1 ACA. No PCA disease proximally. No
cerebellar branch occlusion.

Mild irregularity distal MCA branches suggesting intracranial
atherosclerotic change.

No intracranial aneurysm is evident.
IMPRESSION: Mild intracranial atherosclerotic disease.

No vascular abnormality, specifically no LEFT MCA bifurcation
aneurysm, is seen which might serve as a source of chronic
hemorrhage in the LEFT temporal lobe. Incidental cavernoma is
favored.

## 2016-04-29 ENCOUNTER — Other Ambulatory Visit: Payer: Self-pay | Admitting: Family Medicine

## 2016-05-08 ENCOUNTER — Encounter: Payer: Self-pay | Admitting: Emergency Medicine

## 2016-05-08 ENCOUNTER — Emergency Department: Payer: PRIVATE HEALTH INSURANCE

## 2016-05-08 ENCOUNTER — Emergency Department
Admission: EM | Admit: 2016-05-08 | Discharge: 2016-05-08 | Disposition: A | Discharge status: Home / Self Care | Payer: PRIVATE HEALTH INSURANCE | Attending: Emergency Medicine | Admitting: Emergency Medicine

## 2016-05-08 DIAGNOSIS — R197 Diarrhea, unspecified: Secondary | ICD-10-CM | POA: Insufficient documentation

## 2016-05-08 DIAGNOSIS — R109 Unspecified abdominal pain: Secondary | ICD-10-CM

## 2016-05-08 DIAGNOSIS — R1011 Right upper quadrant pain: Secondary | ICD-10-CM

## 2016-05-08 DIAGNOSIS — Z79899 Other long term (current) drug therapy: Secondary | ICD-10-CM | POA: Diagnosis not present

## 2016-05-08 DIAGNOSIS — F17290 Nicotine dependence, other tobacco product, uncomplicated: Secondary | ICD-10-CM | POA: Diagnosis not present

## 2016-05-08 DIAGNOSIS — I10 Essential (primary) hypertension: Secondary | ICD-10-CM | POA: Diagnosis not present

## 2016-05-08 LAB — COMPREHENSIVE METABOLIC PANEL
ALBUMIN: 5.2 g/dL — AB (ref 3.5–5.0)
ALT: 76 U/L — AB (ref 17–63)
AST: 46 U/L — AB (ref 15–41)
Alkaline Phosphatase: 93 U/L (ref 38–126)
Anion gap: 10 (ref 5–15)
BUN: 11 mg/dL (ref 6–20)
CHLORIDE: 102 mmol/L (ref 101–111)
CO2: 25 mmol/L (ref 22–32)
CREATININE: 0.95 mg/dL (ref 0.61–1.24)
Calcium: 9.7 mg/dL (ref 8.9–10.3)
GFR calc Af Amer: >60 mL/min (ref >60)
Glucose, Bld: 116 mg/dL — ABNORMAL HIGH (ref 65–99)
POTASSIUM: 3.8 mmol/L (ref 3.5–5.1)
SODIUM: 137 mmol/L (ref 135–145)
Total Bilirubin: 0.7 mg/dL (ref 0.3–1.2)
Total Protein: 8.4 g/dL — ABNORMAL HIGH (ref 6.5–8.1)

## 2016-05-08 LAB — CBC
HEMATOCRIT: 49.8 % (ref 40.0–52.0)
Hemoglobin: 17.6 g/dL (ref 13.0–18.0)
MCH: 29.6 pg (ref 26.0–34.0)
MCHC: 35.3 g/dL (ref 32.0–36.0)
MCV: 84 fL (ref 80.0–100.0)
Platelets: 251 10*3/uL (ref 150–440)
RBC: 5.94 MIL/uL — AB (ref 4.40–5.90)
RDW: 13.3 % (ref 11.5–14.5)
WBC: 7.5 10*3/uL (ref 3.8–10.6)

## 2016-05-08 LAB — URINALYSIS COMPLETE WITH MICROSCOPIC (ARMC ONLY)
BACTERIA UA: NONE SEEN
Bilirubin Urine: NEGATIVE
Glucose, UA: NEGATIVE mg/dL
Ketones, ur: NEGATIVE mg/dL
LEUKOCYTES UA: NEGATIVE
Nitrite: NEGATIVE
PH: 5 (ref 5.0–8.0)
Protein, ur: 100 mg/dL — AB
RBC / HPF: NONE SEEN RBC/hpf (ref 0–5)
SQUAMOUS EPITHELIAL / LPF: NONE SEEN
Specific Gravity, Urine: 1.014 (ref 1.005–1.030)

## 2016-05-08 LAB — LIPASE, BLOOD: LIPASE: 27 U/L (ref 11–51)

## 2016-05-08 MED ORDER — DICYCLOMINE HCL 20 MG PO TABS: 20.0000 mg | ORAL_TABLET | Freq: Three times a day (TID) | ORAL | 0 refills | Status: DC | PRN

## 2016-05-08 MED ORDER — GI COCKTAIL ~~LOC~~: 30.0000 mL | Freq: Once | ORAL | Status: DC

## 2016-05-08 NOTE — Discharge Instructions (Signed)
Please seek medical attention for any high fevers, chest pain, shortness of breath, change in behavior, persistent vomiting, bloody stool or any other new or concerning symptoms.  

## 2016-05-08 NOTE — ED Provider Notes (Signed)
[facility] Emergency Department Provider Note    ____________________________________________   I have reviewed the triage vital signs and the nursing notes.   HISTORY  Chief Complaint Abdominal Pain and Diarrhea   History limited by: Not Limited   HPI James Henry is a 45 y.o. male who presents to the emergency department today because of right sided abdominal pain. It has been present for one month. It has been constant. Gradually been getting worse. Somewhat relieved by sitting up. Denies any nausea or vomiting. Has diarrhea but states he has IBS. No problems with urination. No fevers. No chest pain.    Past Medical History:  Diagnosis Date  . Anxiety   . GERD (gastroesophageal reflux disease)   . Hyperlipidemia   . Hypertension     Patient Active Problem List   Diagnosis Date Noted  . Airway hyperreactivity 04/09/2015  . Abnormal liver enzymes 04/09/2015  . Acid reflux 04/09/2015  . HLD (hyperlipidemia) 04/09/2015  . Adaptive colitis 04/09/2015  . Adiposity 04/09/2015  . Acid indigestion 04/08/2015  . Esophageal reflux 04/08/2015  . Elevated cholesterol with elevated triglycerides 04/08/2015  . Severe anxiety with panic 04/08/2015  . Fatty infiltration of liver 04/08/2015  . Anxiety, generalized 12/03/2014  . Combined fat and carbohydrate induced hyperlipemia 09/10/2008  . Essential (primary) hypertension 07/09/2008    History reviewed. No pertinent surgical history.  Prior to Admission medications   Medication Sig Start Date End Date Taking? Authorizing Provider  amitriptyline (ELAVIL) 10 MG tablet Take 10 mg by mouth 3 (three) times daily as needed for sleep.   Yes Historical Provider, MD  metoprolol succinate (TOPROL-XL) 100 MG 24 hr tablet Take 100 mg by mouth daily. Take with or immediately following a meal.   Yes Historical Provider, MD  ranitidine (ZANTAC) 150 MG tablet Take 150 mg by mouth daily as needed for heartburn.    Yes Historical Provider, MD  sucralfate (CARAFATE) 1 G tablet Take 1 g by mouth 4 (four) times daily.  02/12/14  Yes Historical Provider, MD  citalopram (CELEXA) 20 MG tablet Take 20 mg by mouth daily.    Historical Provider, MD  hydrochlorothiazide (HYDRODIURIL) 12.5 MG tablet Take 1 tablet by mouth daily. 06/04/14   Historical Provider, MD  hydrOXYzine (ATARAX/VISTARIL) 25 MG tablet Take 25 mg by mouth at bedtime as needed (sleep).    Historical Provider, MD  lansoprazole (PREVACID) 30 MG capsule Take 1 capsule by mouth daily.    Historical Provider, MD  omega-3 acid ethyl esters (LOVAZA) 1 G capsule Take 1 g by mouth 2 (two) times daily.    Historical Provider, MD  omeprazole (PRILOSEC) 40 MG capsule Take 40 mg by mouth 2 (two) times daily.    Historical Provider, MD    Allergies Codeine; Iodinated diagnostic agents; and Other  Family History  Problem Relation Age of Onset  . Coronary artery disease Father   . Cancer Father   . Depression Brother   . Heart attack Maternal Grandmother     Social History Social History  Substance Use Topics  . Smoking status: Former Games developer  . Smokeless tobacco: Current User    Types: Snuff  . Alcohol use 0.0 oz/week     Comment: MODERATE USE- 21 BEERS EACH WEEKEND    Review of Systems  Constitutional: Negative for fever. Cardiovascular: Negative for chest pain. Respiratory: Negative for shortness of breath. Gastrointestinal: Positive for right sided pain. Neurological: Negative for headaches, focal weakness or numbness.  10-point ROS  otherwise negative.  ____________________________________________   PHYSICAL EXAM:  VITAL SIGNS: ED Triage Vitals  Enc Vitals Group     BP 05/08/16 0823 (!) 166/82     Pulse Rate 05/08/16 0823 (!) 101     Resp 05/08/16 0823 18     Temp 05/08/16 0823 98.7 F (37.1 C)     Temp Source 05/08/16 0823 Oral     SpO2 05/08/16 0823 98 %     Weight 05/08/16 0823 237 lb (107.5 kg)     Height 05/08/16 0823 5'  9" (1.753 m)     Head Circumference --      Peak Flow --      Pain Score 05/08/16 0824 8   Constitutional: Alert and oriented. Well appearing and in no distress. Eyes: Conjunctivae are normal. PERRL. Normal extraocular movements. ENT   Head: Normocephalic and atraumatic.   Nose: No congestion/rhinnorhea.   Mouth/Throat: Mucous membranes are moist.   Neck: No stridor. Hematological/Lymphatic/Immunilogical: No cervical lymphadenopathy. Cardiovascular: Normal rate, regular rhythm.  No murmurs, rubs, or gallops. Respiratory: Normal respiratory effort without tachypnea nor retractions. Breath sounds are clear and equal bilaterally. No wheezes/rales/rhonchi. Gastrointestinal: Soft and minimally tender to palpitation of the right upper quadrant. No distention.  Genitourinary: Deferred Musculoskeletal: Normal range of motion in all extremities. No joint effusions.  No lower extremity tenderness nor edema. Neurologic:  Normal speech and language. No gross focal neurologic deficits are appreciated.  Skin:  Skin is warm, dry and intact. No rash noted. Psychiatric: Mood and affect are normal. Speech and behavior are normal. Patient exhibits appropriate insight and judgment.  ____________________________________________    LABS (pertinent positives/negatives)  Labs Reviewed  COMPREHENSIVE METABOLIC PANEL - Abnormal; Notable for the following:       Result Value   Glucose, Bld 116 (*)    Total Protein 8.4 (*)    Albumin 5.2 (*)    AST 46 (*)    ALT 76 (*)    All other components within normal limits  CBC - Abnormal; Notable for the following:    RBC 5.94 (*)    All other components within normal limits  URINALYSIS COMPLETEWITH MICROSCOPIC ([facility] ONLY) - Abnormal; Notable for the following:    Color, Urine YELLOW (*)    APPearance CLEAR (*)    Hgb urine dipstick 1+ (*)    Protein, ur 100 (*)    All other components within normal limits  LIPASE, BLOOD      ____________________________________________   EKG  None  ____________________________________________    RADIOLOGY  [place] RUQ IMPRESSION: Diffuse hepatic steatosis.  Normal gallbladder and biliary tree.  ____________________________________________   PROCEDURES  Procedures  ____________________________________________   INITIAL IMPRESSION / ASSESSMENT AND PLAN / ED COURSE  Pertinent labs & imaging results that were available during my care of the patient were reviewed by me and considered in my medical decision making (see chart for details).  Patient presents with right sided and ruq pain. On exam minimally tender to palpitation of the ruq. No leukocytosis. Will get RUQ [place].   Clinical Course   [place] negative. At this point doubt intraabdominal infection given lack of fever or leukocytosis. Pain could be related to IBS. Will give prescription for bentyl, instructed patient to follow up with PCP. ____________________________________________   FINAL CLINICAL IMPRESSION(S) / ED DIAGNOSES  Final diagnoses:  RUQ pain  Abdominal pain, unspecified abdominal location     Note: This dictation was prepared with Dragon dictation. Any transcriptional errors that result from  this process are unintentional    Phineas Semen, MD 05/08/16 1409

## 2016-06-01 ENCOUNTER — Other Ambulatory Visit: Payer: Self-pay | Admitting: Nurse Practitioner

## 2016-06-01 ENCOUNTER — Other Ambulatory Visit: Payer: Self-pay | Admitting: Family Medicine

## 2016-06-01 DIAGNOSIS — R748 Abnormal levels of other serum enzymes: Secondary | ICD-10-CM

## 2016-06-01 DIAGNOSIS — K760 Fatty (change of) liver, not elsewhere classified: Secondary | ICD-10-CM

## 2016-06-05 ENCOUNTER — Ambulatory Visit: Payer: PRIVATE HEALTH INSURANCE

## 2016-06-19 ENCOUNTER — Ambulatory Visit
Admission: RE | Admit: 2016-06-19 | Discharge: 2016-06-19 | Disposition: A | Discharge status: Home / Self Care | Payer: PRIVATE HEALTH INSURANCE | Source: Ambulatory Visit | Attending: Nurse Practitioner | Admitting: Nurse Practitioner

## 2016-06-19 DIAGNOSIS — K760 Fatty (change of) liver, not elsewhere classified: Secondary | ICD-10-CM | POA: Insufficient documentation

## 2016-06-19 DIAGNOSIS — R748 Abnormal levels of other serum enzymes: Secondary | ICD-10-CM | POA: Diagnosis present

## 2016-06-22 ENCOUNTER — Other Ambulatory Visit: Payer: Self-pay | Admitting: Family Medicine

## 2016-06-22 MED ORDER — METOPROLOL SUCCINATE ER 100 MG PO TB24: 100.0000 mg | ORAL_TABLET | Freq: Every day | ORAL | 3 refills | Status: DC

## 2016-06-22 NOTE — Progress Notes (Signed)
Seen in [facility] today. BP 128/78 and needs refill of Metoprolol. [facility]

## 2016-11-24 ENCOUNTER — Telehealth: Payer: Self-pay | Admitting: Emergency Medicine

## 2016-11-24 NOTE — Telephone Encounter (Signed)
LMTCB to schedule a follow up appointment  

## 2016-11-24 NOTE — Telephone Encounter (Signed)
Pt wife called and wanting to know if his nurse could give him a call about giving pt something stronger for anxiety or if he needed to come. Please advise.    [facility] [address]   [phone]- Rosey Bath(Teresa pt wife)

## 2016-11-24 NOTE — Telephone Encounter (Signed)
Need to schedule follow up appointment

## 2016-12-07 ENCOUNTER — Other Ambulatory Visit: Payer: Self-pay | Admitting: Family Medicine

## 2016-12-07 DIAGNOSIS — F418 Other specified anxiety disorders: Secondary | ICD-10-CM

## 2016-12-07 MED ORDER — GABAPENTIN 300 MG PO CAPS: 300.0000 mg | ORAL_CAPSULE | Freq: Two times a day (BID) | ORAL | 0 refills | Status: DC

## 2016-12-07 NOTE — Progress Notes (Signed)
Needs refill of Gabapentin used for Cervical Neuropathy until Dr. Malvin JohnsPotter (neurologist) can follow up. Trying to postpone any surgery. Also, having flares of anxiety/panic with depression and requests referral to a psychiatrist (prefers Dr. Wallace GoingAlton Williams).

## 2016-12-12 ENCOUNTER — Encounter: Payer: Self-pay | Admitting: Emergency Medicine

## 2016-12-12 ENCOUNTER — Emergency Department
Admission: EM | Admit: 2016-12-12 | Discharge: 2016-12-12 | Disposition: A | Discharge status: Home / Self Care | Payer: PRIVATE HEALTH INSURANCE | Attending: Emergency Medicine | Admitting: Emergency Medicine

## 2016-12-12 ENCOUNTER — Emergency Department: Payer: PRIVATE HEALTH INSURANCE

## 2016-12-12 DIAGNOSIS — J45909 Unspecified asthma, uncomplicated: Secondary | ICD-10-CM | POA: Insufficient documentation

## 2016-12-12 DIAGNOSIS — R202 Paresthesia of skin: Secondary | ICD-10-CM | POA: Diagnosis not present

## 2016-12-12 DIAGNOSIS — I10 Essential (primary) hypertension: Secondary | ICD-10-CM | POA: Diagnosis not present

## 2016-12-12 DIAGNOSIS — R200 Anesthesia of skin: Secondary | ICD-10-CM | POA: Diagnosis present

## 2016-12-12 DIAGNOSIS — F17290 Nicotine dependence, other tobacco product, uncomplicated: Secondary | ICD-10-CM | POA: Diagnosis not present

## 2016-12-12 LAB — BASIC METABOLIC PANEL
Anion gap: 8 (ref 5–15)
BUN: 17 mg/dL (ref 6–20)
CALCIUM: 9.7 mg/dL (ref 8.9–10.3)
CO2: 25 mmol/L (ref 22–32)
CREATININE: 0.74 mg/dL (ref 0.61–1.24)
Chloride: 106 mmol/L (ref 101–111)
GFR calc Af Amer: >60 mL/min (ref >60)
GFR calc non Af Amer: >60 mL/min (ref >60)
Glucose, Bld: 94 mg/dL (ref 65–99)
Potassium: 4 mmol/L (ref 3.5–5.1)
Sodium: 139 mmol/L (ref 135–145)

## 2016-12-12 LAB — CBC
HEMATOCRIT: 44.6 % (ref 40.0–52.0)
Hemoglobin: 15.6 g/dL (ref 13.0–18.0)
MCH: 29.2 pg (ref 26.0–34.0)
MCHC: 35 g/dL (ref 32.0–36.0)
MCV: 83.2 fL (ref 80.0–100.0)
Platelets: 273 10*3/uL (ref 150–440)
RBC: 5.35 MIL/uL (ref 4.40–5.90)
RDW: 14.4 % (ref 11.5–14.5)
WBC: 9.6 10*3/uL (ref 3.8–10.6)

## 2016-12-12 LAB — TROPONIN I: Troponin I: <0.03 ng/mL (ref <0.03)

## 2016-12-12 NOTE — Discharge Instructions (Signed)
You have been seen in the emergency department today for left facial numbness. Your workup including MRI and labs are normal. Please follow-up with neurology for recheck/reevaluation. Return to the emergency department for any worsening numbness, any weakness, confusion or slurred speech.

## 2016-12-12 NOTE — ED Notes (Signed)
Spoke with Dr Lenard LancePaduchowski about pt's hx and current conditions, verbal order for MRI brain w/out contrast.

## 2016-12-12 NOTE — ED Provider Notes (Signed)
[facility] Emergency Department Provider Note  Time seen: 4:00 PM  I have reviewed the triage vital signs and the nursing notes.   HISTORY  Chief Complaint Other (facial numbness x3 days)    HPI James Henry is a 46 y.o. male with a past medical history of anxiety, hypertension, hyperlipidemia, presents to the emergency department with left facial numbness. According to the patient he has been having intermittent areas of numbness over the past 20 years which his doctor has suspected MS although his MRIs have never shown any lesions per patient. No history of stroke. Patient states over the past 1-2 months he has been experiencing fairly constant left facial numbness/tingling. Denies any weakness. Denies any confusion, slurred speech, weakness/numbness of any arm or leg, or trouble walking. Patient currently describes very mild numbness/tingling sensation to the left face.  Past Medical History:  Diagnosis Date  . Anxiety   . GERD (gastroesophageal reflux disease)   . Hyperlipidemia   . Hypertension     Patient Active Problem List   Diagnosis Date Noted  . Airway hyperreactivity 04/09/2015  . Abnormal liver enzymes 04/09/2015  . Acid reflux 04/09/2015  . HLD (hyperlipidemia) 04/09/2015  . Adaptive colitis 04/09/2015  . Adiposity 04/09/2015  . Acid indigestion 04/08/2015  . Esophageal reflux 04/08/2015  . Elevated cholesterol with elevated triglycerides 04/08/2015  . Severe anxiety with panic 04/08/2015  . Fatty infiltration of liver 04/08/2015  . Anxiety, generalized 12/03/2014  . Combined fat and carbohydrate induced hyperlipemia 09/10/2008  . Essential (primary) hypertension 07/09/2008    No past surgical history on file.  Prior to Admission medications   Medication Sig Start Date End Date Taking? Authorizing Provider  amitriptyline (ELAVIL) 10 MG tablet Take 10 mg by mouth 3 (three) times daily as needed for sleep.    Historical  Provider, MD  dicyclomine (BENTYL) 20 MG tablet Take 1 tablet (20 mg total) by mouth 3 (three) times daily as needed (abdominal pain). 05/08/16 05/08/17  Phineas Semen, MD  gabapentin (NEURONTIN) 300 MG capsule Take 1 capsule (300 mg total) by mouth 2 (two) times daily. 12/07/16   Jodell Cipro Chrismon, PA  hydrOXYzine (ATARAX/VISTARIL) 25 MG tablet Take 25 mg by mouth at bedtime as needed (sleep).    Historical Provider, MD  metoprolol succinate (TOPROL-XL) 100 MG 24 hr tablet Take 1 tablet (100 mg total) by mouth daily. Take with or immediately following a meal. 06/22/16   Jodell Cipro Chrismon, PA  omega-3 acid ethyl esters (LOVAZA) 1 G capsule Take 1 g by mouth 2 (two) times daily.    Historical Provider, MD  omeprazole (PRILOSEC) 40 MG capsule Take 40 mg by mouth 2 (two) times daily.    Historical Provider, MD  ranitidine (ZANTAC) 150 MG tablet Take 150 mg by mouth daily as needed for heartburn.    Historical Provider, MD  sucralfate (CARAFATE) 1 G tablet Take 1 g by mouth 4 (four) times daily.  02/12/14   Historical Provider, MD    Allergies  Allergen Reactions  . Codeine   . Gadolinium Derivatives Hives    The patient experienced an allergic reaction to the Magnevist (hives). The patient was taken to the Emergency Room for observation on 06/12/2000 during MRI Brain [facility]  . Iodinated Diagnostic Agents Hives    Family History  Problem Relation Age of Onset  . Coronary artery disease Father   . Cancer Father   . Depression Brother   . Heart attack Maternal Grandmother  Social History Social History  Substance Use Topics  . Smoking status: Former Games developermoker  . Smokeless tobacco: Current User    Types: Snuff  . Alcohol use 0.0 oz/week     Comment: MODERATE USE- 21 BEERS EACH WEEKEND    Review of Systems Constitutional: Negative for fever. Cardiovascular: Negative for chest pain.Negative for palpitations. Respiratory: Negative for shortness of breath. Gastrointestinal: Negative for  abdominal pain Neurological: Negative for headache. Left facial numbness for the past 2 months. 10-point ROS otherwise negative.  ____________________________________________   PHYSICAL EXAM:  VITAL SIGNS: ED Triage Vitals  Enc Vitals Group     BP 12/12/16 1418 139/81     Pulse Rate 12/12/16 1418 (!) 59     Resp 12/12/16 1418 16     Temp 12/12/16 1418 98.2 F (36.8 C)     Temp Source 12/12/16 1418 Oral     SpO2 12/12/16 1418 100 %     Weight 12/12/16 1419 199 lb (90.3 kg)     Height 12/12/16 1419 5\' 8"  (1.727 m)     Head Circumference --      Peak Flow --      Pain Score 12/12/16 1419 0     Pain Loc --      Pain Edu? --      Excl. in GC? --     Constitutional: Alert and oriented. Well appearing and in no distress. Eyes: Normal exam ENT   Head: Normocephalic and atraumatic.   Mouth/Throat: Mucous membranes are moist. Cardiovascular: Normal rate, regular rhythm. No murmur Respiratory: Normal respiratory effort without tachypnea nor retractions. Breath sounds are clear  Gastrointestinal: Soft and nontender. No distention.  Musculoskeletal: Nontender with normal range of motion in all extremities. Neurologic:  Normal speech and language. No gross focal neurologic deficits. Equal grip strengths bilaterally. 5/5 motor in all extremities. No pronator drift. Sensation intact and equal bilaterally. Cranial nerves intact. No neurological deficits identified. Sensation intact/equal subjectively bilaterally in the face. Skin:  Skin is warm, dry and intact.  Psychiatric: Mood and affect are normal.   ____________________________________________     RADIOLOGY  MRI negative/unchanged  ____________________________________________   INITIAL IMPRESSION / ASSESSMENT AND PLAN / ED COURSE  Pertinent labs & imaging results that were available during my care of the patient were reviewed by me and considered in my medical decision making (see chart for details).  Patient  presents with 2 months of left facial numbness/tingling. Patient states he has seen a neurologist multiple times in the past as well as his primary care doctor who believe he might have MS although states he's never had a lesion on MRI. Patient's MRI today is normal, unchanged from prior and 2016. Patient has a normal neurological exam. Labs are normal patient will be discharged home with neurology follow-up.  ____________________________________________   FINAL CLINICAL IMPRESSION(S) / ED DIAGNOSES  Paresthesia    Minna AntisKevin Mckoy Bhakta, MD 12/12/16 [phone]

## 2016-12-12 NOTE — ED Notes (Signed)
Pt in MRI. Will come to room after MRI

## 2016-12-12 NOTE — ED Triage Notes (Signed)
Pt here with c/o left sided facial, tongue and neck numbness x3 days. Pt denies new difficulty of swallowing, reports hx of dysphagia x20 years. Pt reports possibly dx with MS.

## 2017-04-27 ENCOUNTER — Encounter: Payer: Self-pay | Admitting: Emergency Medicine

## 2017-04-27 ENCOUNTER — Emergency Department: Payer: No Typology Code available for payment source

## 2017-04-27 ENCOUNTER — Emergency Department
Admission: EM | Admit: 2017-04-27 | Discharge: 2017-04-28 | Disposition: A | Discharge status: Home / Self Care | Payer: No Typology Code available for payment source | Attending: Emergency Medicine | Admitting: Emergency Medicine

## 2017-04-27 DIAGNOSIS — R195 Other fecal abnormalities: Secondary | ICD-10-CM | POA: Diagnosis not present

## 2017-04-27 DIAGNOSIS — R197 Diarrhea, unspecified: Secondary | ICD-10-CM | POA: Diagnosis present

## 2017-04-27 DIAGNOSIS — M545 Low back pain: Secondary | ICD-10-CM | POA: Diagnosis not present

## 2017-04-27 DIAGNOSIS — F17220 Nicotine dependence, chewing tobacco, uncomplicated: Secondary | ICD-10-CM | POA: Insufficient documentation

## 2017-04-27 DIAGNOSIS — R634 Abnormal weight loss: Secondary | ICD-10-CM | POA: Diagnosis not present

## 2017-04-27 DIAGNOSIS — I10 Essential (primary) hypertension: Secondary | ICD-10-CM | POA: Diagnosis not present

## 2017-04-27 DIAGNOSIS — K529 Noninfective gastroenteritis and colitis, unspecified: Secondary | ICD-10-CM

## 2017-04-27 DIAGNOSIS — Z79899 Other long term (current) drug therapy: Secondary | ICD-10-CM | POA: Diagnosis not present

## 2017-04-27 LAB — COMPREHENSIVE METABOLIC PANEL
ALBUMIN: 4.9 g/dL (ref 3.5–5.0)
ALT: 25 U/L (ref 17–63)
AST: 24 U/L (ref 15–41)
Alkaline Phosphatase: 63 U/L (ref 38–126)
Anion gap: 6 (ref 5–15)
BUN: 16 mg/dL (ref 6–20)
CHLORIDE: 109 mmol/L (ref 101–111)
CO2: 24 mmol/L (ref 22–32)
CREATININE: 1.15 mg/dL (ref 0.61–1.24)
Calcium: 9.2 mg/dL (ref 8.9–10.3)
GFR calc Af Amer: >60 mL/min (ref >60)
GFR calc non Af Amer: >60 mL/min (ref >60)
GLUCOSE: 96 mg/dL (ref 65–99)
POTASSIUM: 3.7 mmol/L (ref 3.5–5.1)
SODIUM: 139 mmol/L (ref 135–145)
Total Bilirubin: 1.2 mg/dL (ref 0.3–1.2)
Total Protein: 8 g/dL (ref 6.5–8.1)

## 2017-04-27 LAB — CBC
HEMATOCRIT: 47.3 % (ref 40.0–52.0)
Hemoglobin: 16.1 g/dL (ref 13.0–18.0)
MCH: 28.9 pg (ref 26.0–34.0)
MCHC: 34.1 g/dL (ref 32.0–36.0)
MCV: 84.8 fL (ref 80.0–100.0)
Platelets: 248 10*3/uL (ref 150–440)
RBC: 5.57 MIL/uL (ref 4.40–5.90)
RDW: 13.9 % (ref 11.5–14.5)
WBC: 9.1 10*3/uL (ref 3.8–10.6)

## 2017-04-27 LAB — URINALYSIS, COMPLETE (UACMP) WITH MICROSCOPIC
BACTERIA UA: NONE SEEN
Bilirubin Urine: NEGATIVE
Glucose, UA: NEGATIVE mg/dL
Hgb urine dipstick: NEGATIVE
KETONES UR: NEGATIVE mg/dL
LEUKOCYTES UA: NEGATIVE
Nitrite: NEGATIVE
Protein, ur: 30 mg/dL — AB
SPECIFIC GRAVITY, URINE: 1.019 (ref 1.005–1.030)
SQUAMOUS EPITHELIAL / LPF: NONE SEEN
pH: 6 (ref 5.0–8.0)

## 2017-04-27 LAB — LIPASE, BLOOD: LIPASE: 31 U/L (ref 11–51)

## 2017-04-27 MED ORDER — IOPAMIDOL (ISOVUE-300) INJECTION 61%
100.0000 mL | Freq: Once | INTRAVENOUS | Status: AC | PRN
  Administered 2017-04-27: 100 mL via INTRAVENOUS

## 2017-04-27 MED ORDER — IOPAMIDOL (ISOVUE-300) INJECTION 61%
30.0000 mL | Freq: Once | INTRAVENOUS | Status: AC
  Administered 2017-04-27: 30 mL via ORAL

## 2017-04-27 NOTE — ED Notes (Signed)
Patient transported to CT 

## 2017-04-27 NOTE — ED Notes (Signed)
Pt. States he is taking imodium and bentyl with some relief.

## 2017-04-27 NOTE — ED Provider Notes (Signed)
[facility] ([facility]) Emergency Department Provider Note  ____________________________________________   First MD Initiated Contact with Patient 04/27/17 2255     (approximate)  I have reviewed the triage vital signs and the nursing notes.   HISTORY  Chief Complaint Back Pain; Pale stools; and Diarrhea    HPI James Henry is a 46 y.o. male with a history of intermittent episodes of abdominal pain and hepatic steatosis over the last few years.  He presents today for evaluation of 6 months of loose stools.  Over the last few weeks he states that they have turned pale.  He states that he is losing weight unintentionally, as much as 2 pounds per week over the last few months.  He frequently has low back pain after eating.  He has an appointment with the GI doctor that he has seen in the past in about 3 weeks but he does not feel he can wait that long.  He denies nausea and vomiting.  He currently has no abdominal pain; in the past he has taken Bentyl which has been helpful.  Imodium helps the diarrhea little bit.  He clarified that his stools are loose but he only has one bowel movement a day.  He has not ever seen any blood in his stool.  He denies fever/chills, chest pain, shortness of breath, nausea, vomiting.  Nothing in particular makes his symptoms worse.  He is frustrated with the long-term nature of his symptoms and plans to see his doctor in 3 weeks but he tells me he feels like he needs a CT scan tonight.   Past Medical History:  Diagnosis Date  . Anxiety   . GERD (gastroesophageal reflux disease)   . Hyperlipidemia   . Hypertension     Patient Active Problem List   Diagnosis Date Noted  . Airway hyperreactivity 04/09/2015  . Abnormal liver enzymes 04/09/2015  . Acid reflux 04/09/2015  . HLD (hyperlipidemia) 04/09/2015  . Adaptive colitis 04/09/2015  . Adiposity 04/09/2015  . Acid indigestion 04/08/2015  . Esophageal reflux 04/08/2015  . Elevated  cholesterol with elevated triglycerides 04/08/2015  . Severe anxiety with panic 04/08/2015  . Fatty infiltration of liver 04/08/2015  . Anxiety, generalized 12/03/2014  . Combined fat and carbohydrate induced hyperlipemia 09/10/2008  . Essential (primary) hypertension 07/09/2008    History reviewed. No pertinent surgical history.  Prior to Admission medications   Medication Sig Start Date End Date Taking? Authorizing Provider  amitriptyline (ELAVIL) 10 MG tablet Take 10 mg by mouth 3 (three) times daily as needed for sleep.    [provider]  dicyclomine (BENTYL) 10 MG capsule Take 1 capsule (10 mg total) by mouth 4 (four) times daily. 04/28/17 05/12/17  Loleta Rose, MD  gabapentin (NEURONTIN) 300 MG capsule Take 1 capsule (300 mg total) by mouth 2 (two) times daily. 12/07/16   Chrismon, Jodell Cipro, PA  hydrOXYzine (ATARAX/VISTARIL) 25 MG tablet Take 25 mg by mouth at bedtime as needed (sleep).    [provider]  metoprolol succinate (TOPROL-XL) 100 MG 24 hr tablet Take 1 tablet (100 mg total) by mouth daily. Take with or immediately following a meal. 06/22/16   Chrismon, Jodell Cipro, PA  omega-3 acid ethyl esters (LOVAZA) 1 G capsule Take 1 g by mouth 2 (two) times daily.    [provider]  omeprazole (PRILOSEC) 40 MG capsule Take 40 mg by mouth 2 (two) times daily.    [provider]  ranitidine (ZANTAC) 150 MG  tablet Take 150 mg by mouth daily as needed for heartburn.    [provider]  sucralfate (CARAFATE) 1 G tablet Take 1 g by mouth 4 (four) times daily.  02/12/14   [provider]    Allergies Codeine and Gadolinium derivatives  Family History  Problem Relation Age of Onset  . Coronary artery disease Father   . Cancer Father   . Depression Brother   . Heart attack Maternal Grandmother     Social History Social History  Substance Use Topics  . Smoking status: Former Games developer  . Smokeless tobacco: Current User    Types: Snuff    . Alcohol use 0.0 oz/week     Comment: MODERATE USE- 21 BEERS EACH WEEKEND    Review of Systems Constitutional: No fever/chills Eyes: No visual changes. ENT: No sore throat. Cardiovascular: Denies chest pain. Respiratory: Denies shortness of breath. Gastrointestinal: Soft stools occurring once a day for the last 6 months.  Pale-colored stools.  Intermittent abdominal pain but recently he has been having unintentional weight loss and back pain or eating. Genitourinary: Negative for dysuria. Musculoskeletal: Negative for neck pain.  Dull aching back pain after eating. Integumentary: Negative for rash. Neurological: Negative for headaches, focal weakness or numbness.   ____________________________________________   PHYSICAL EXAM:  VITAL SIGNS: ED Triage Vitals  Enc Vitals Group     BP 04/27/17 1656 (!) 127/58     Pulse Rate 04/27/17 1656 84     Resp 04/27/17 1656 16     Temp 04/27/17 1656 98.1 F (36.7 C)     Temp Source 04/27/17 1656 Oral     SpO2 04/27/17 1656 98 %     Weight 04/27/17 1657 88.5 kg (195 lb)     Height 04/27/17 1657 1.727 m (5\' 8" )     Head Circumference --      Peak Flow --      Pain Score 04/27/17 1702 7     Pain Loc --      Pain Edu? --      Excl. in GC? --     Constitutional: Alert and oriented. Well appearing and in no acute distress. Eyes: Conjunctivae are normal.  Head: Atraumatic. Nose: No congestion/rhinnorhea. Mouth/Throat: Mucous membranes are moist. Neck: No stridor.  No meningeal signs.   Cardiovascular: Normal rate, regular rhythm. Good peripheral circulation. Grossly normal heart sounds. Respiratory: Normal respiratory effort.  No retractions. Lungs CTAB. Gastrointestinal: Soft and nontender. No distention. No CVA tenderness Musculoskeletal: No lower extremity tenderness nor edema. No gross deformities of extremities. Neurologic:  Normal speech and language. No gross focal neurologic deficits are appreciated.  Skin:  Skin is warm,  dry and intact. No rash noted. No jaundice   ____________________________________________   LABS (all labs ordered are listed, but only abnormal results are displayed)  Labs Reviewed  URINALYSIS, COMPLETE (UACMP) WITH MICROSCOPIC - Abnormal; Notable for the following:       Result Value   Color, Urine YELLOW (*)    APPearance HAZY (*)    Protein, ur 30 (*)    All other components within normal limits  LIPASE, BLOOD  COMPREHENSIVE METABOLIC PANEL  CBC   ____________________________________________  EKG  None - EKG not ordered by ED physician ____________________________________________  RADIOLOGY   Ct Abdomen Pelvis W Contrast  Result Date: 04/27/2017 CLINICAL DATA:  Initial evaluation for acute generalized abdominal pain and diarrhea for 6 months. EXAM: CT ABDOMEN AND PELVIS WITH CONTRAST TECHNIQUE: Multidetector CT imaging of the abdomen  and pelvis was performed using the standard protocol following bolus administration of intravenous contrast. CONTRAST:  100mL ISOVUE-300 IOPAMIDOL (ISOVUE-300) INJECTION 61% COMPARISON:  Prior CT from 12/02/2014. FINDINGS: Lower chest: Visualized lung bases are clear. Hepatobiliary: Liver demonstrates a normal contrast enhanced appearance. Gallbladder within normal limits. No biliary dilatation. Pancreas: Pancreas within normal limits. Spleen: Spleen within normal limits. Adrenals/Urinary Tract: Adrenal glands are normal. Kidneys equal in size with symmetric enhancement. Few scattered hypodensities within the kidneys bilaterally, some of which are too small the characterize, but likely reflects small cyst. No focal enhancing renal mass. Bilateral nonobstructive nephrolithiasis, largest of which on the right is positioned in the lower pole and measures 3 mm. Largest on the left is also position within the lower pole and measures 7 mm. No hydronephrosis. No radiopaque calculi seen along the course of either renal collecting system. No hydroureter.  Bladder within normal limits. Stomach/Bowel: Stomach moderately distended with enteric contrast material within the gastric lumen. Stomach otherwise unremarkable. No evidence for bowel obstruction. Appendix within normal limits. No abnormal wall thickening, mucosal enhancement, or inflammatory fat stranding seen about the bowels. Few radiopaque fills noted within the cecum and proximal transverse colon. Vascular/Lymphatic: Normal intravascular enhancement seen throughout the intra-abdominal aorta and its branch vessels. No pathologically enlarged intra-abdominal or pelvic lymph nodes. Reproductive: Prostate mildly enlarged measuring 5.5 cm in transverse diameter. Other: No free air or fluid. Musculoskeletal: No acute osseous abnormality. No worrisome lytic or blastic osseous lesions. IMPRESSION: 1. No CT evidence for acute intra-abdominal or pelvic process. 2. Bilateral nonobstructive nephrolithiasis as above. 3. Mildly enlarged prostate. Electronically Signed   By: Rise MuBenjamin  McClintock M.D.   On: 04/27/2017 23:58    ____________________________________________   PROCEDURES  Critical Care performed: No   Procedure(s) performed:   Procedures   ____________________________________________   INITIAL IMPRESSION / ASSESSMENT AND PLAN / ED COURSE  Pertinent labs & imaging results that were available during my care of the patient were reviewed by me and considered in my medical decision making (see chart for details).  The patient is well-appearing and in no acute distress with normal vital signs and normal lab work.  He is obviously frustrated about the duration of his symptoms and he does have an appointment with GI scheduled in a few weeks.  I explained to him that it is very unlikely we will identify an acute or emergent cause of his chronic symptoms tonight in the emergency department and he is quite insistent that we obtain a CT scan tonight.  I discussed the risks and benefits but I also  understand the point that he is concerned that there may be a new problem since he obtained an ultrasound a few months ago and the results of the CT scan may directly affect his outpatient GI workup.  I agreed to obtain a CT scan with oral and IV contrast tonight in an attempt to rule out any acute or emergent issues such as developing neoplasm or biliary obstruction; I considered ultrasound but he had an ultrasound a few months ago that was unremarkable and he is also not interested in obtaining an ultrasound tonight.  Of note, he had a nonspecific allergy to ionized contrast material listed in his allergy list (possible hives) in addition to an allergy to gadolinium.  However the patient adamantly denies ever having had issues with his CT scans in the past.  Neither one of us want to do the lengthy prep process for the CT scan and he is willing  to sign a waiver stating that he has never had issues in the past.  At his request and insistence I have removed that item from his allergy list and I informed the CT technologist about the update.   Clinical Course as of Apr 28 42  Sat Apr 28, 2017  0039 No abnormal findings on CT scan including no ductal dilatation or other biliary issues.  I updated the patient and he requested another prescription for Bentyl to tide him over until he can follow up with a GI doctor.  I will provide that.  I gave my usual and customary return precautions.     [CF]    Clinical Course User Index [CF] Loleta Rose, MD    ____________________________________________  FINAL CLINICAL IMPRESSION(S) / ED DIAGNOSES  Final diagnoses:  Chronic diarrhea  Pale stool     MEDICATIONS GIVEN DURING THIS VISIT:  Medications  iopamidol (ISOVUE-300) 61 % injection 30 mL (30 mLs Oral Contrast Given 04/27/17 2328)  iopamidol (ISOVUE-300) 61 % injection 100 mL (100 mLs Intravenous Contrast Given 04/27/17 2338)     NEW OUTPATIENT MEDICATIONS STARTED DURING THIS VISIT:  New  Prescriptions   DICYCLOMINE (BENTYL) 10 MG CAPSULE    Take 1 capsule (10 mg total) by mouth 4 (four) times daily.    Modified Medications   No medications on file    Discontinued Medications   DICYCLOMINE (BENTYL) 20 MG TABLET    Take 1 tablet (20 mg total) by mouth 3 (three) times daily as needed (abdominal pain).     Note:  This document was prepared using Dragon voice recognition software and may include unintentional dictation errors.    Loleta Rose, MD 04/28/17 [phone]

## 2017-04-27 NOTE — ED Notes (Addendum)
Pt. States diarrhea for the past 6 months.  Pt. States he started to notice stools were turning pale.  Pt. Denies change in medication in last 6 months.   Pt.  States appetite is good, but states losing about 2 pounds per week. Pt. States back pain after eating.  Pt. States he has appointment with PCP in Aug.

## 2017-04-27 NOTE — ED Triage Notes (Signed)
Pt reports diarrhea for six months. Pt reports stools have turned white in color. Pt reports middle back pain that worsens after eating. Pt reports he is losing two pounds per week. Denies nausea or vomiting.

## 2017-04-28 MED ORDER — DICYCLOMINE HCL 10 MG PO CAPS: 10.0000 mg | ORAL_CAPSULE | Freq: Four times a day (QID) | ORAL | 1 refills | Status: DC

## 2017-04-28 NOTE — Discharge Instructions (Signed)

## 2017-04-28 NOTE — ED Notes (Signed)
Pt. Driving himself home. 

## 2017-08-21 ENCOUNTER — Other Ambulatory Visit: Payer: Self-pay | Admitting: Family Medicine

## 2018-02-05 ENCOUNTER — Encounter: Payer: Self-pay | Admitting: Family Medicine

## 2018-02-05 ENCOUNTER — Ambulatory Visit
Admission: RE | Admit: 2018-02-05 | Discharge: 2018-02-05 | Disposition: A | Discharge status: Home / Self Care | Payer: BLUE CROSS/BLUE SHIELD | Source: Ambulatory Visit | Attending: Family Medicine | Admitting: Family Medicine

## 2018-02-05 ENCOUNTER — Ambulatory Visit (INDEPENDENT_AMBULATORY_CARE_PROVIDER_SITE_OTHER): Payer: BLUE CROSS/BLUE SHIELD | Admitting: Family Medicine

## 2018-02-05 ENCOUNTER — Other Ambulatory Visit: Payer: Self-pay | Admitting: Family Medicine

## 2018-02-05 VITALS — BP 146/84 | HR 63 | Temp 98.2°F | Wt 226.4 lb

## 2018-02-05 DIAGNOSIS — M25552 Pain in left hip: Secondary | ICD-10-CM | POA: Diagnosis not present

## 2018-02-05 LAB — POCT URINALYSIS DIPSTICK
Bilirubin, UA: NEGATIVE
Glucose, UA: NEGATIVE
KETONES UA: NEGATIVE
Leukocytes, UA: NEGATIVE
Nitrite, UA: NEGATIVE
PH UA: 5 (ref 5.0–8.0)
PROTEIN UA: 30
Spec Grav, UA: >=1.03 — AB (ref 1.010–1.025)
UROBILINOGEN UA: 0.2 U/dL

## 2018-02-05 MED ORDER — GABAPENTIN 300 MG PO CAPS: 300.0000 mg | ORAL_CAPSULE | Freq: Two times a day (BID) | ORAL | 3 refills | Status: DC

## 2018-02-05 NOTE — Progress Notes (Signed)
Patient: James Henry Male    DOB: 17-Jan-1971   47 y.o.   MRN: [id number] Visit Date: 02/05/2018  Today's Provider: Dortha Kern, PA   Chief Complaint  Patient presents with  . Groin Pain   Subjective:    Groin Pain  The patient's primary symptoms include pelvic pain. The patient's pertinent negatives include no genital injury, penile discharge, scrotal swelling or testicular pain. This is a new problem. Episode onset: 3-4 weeks ago. The problem occurs constantly. The problem has been unchanged. Pain severity now: dull ache. Associated symptoms include joint pain. Exacerbated by: standing and sitting for long periods of time.   Past Medical History:  Diagnosis Date  . Anxiety   . GERD (gastroesophageal reflux disease)   . Hyperlipidemia   . Hypertension    History reviewed. No pertinent surgical history. Family History  Problem Relation Age of Onset  . Coronary artery disease Father   . Cancer Father   . Depression Brother   . Heart attack Maternal Grandmother    Patient Active Problem List   Diagnosis Date Noted  . Chronic neck pain 06/17/2015  . Other dysphagia 06/17/2015  . Airway hyperreactivity 04/09/2015  . Abnormal liver enzymes 04/09/2015  . Acid reflux 04/09/2015  . HLD (hyperlipidemia) 04/09/2015  . Adaptive colitis 04/09/2015  . Adiposity 04/09/2015  . Acid indigestion 04/08/2015  . Esophageal reflux 04/08/2015  . Elevated cholesterol with elevated triglycerides 04/08/2015  . Severe anxiety with panic 04/08/2015  . Fatty infiltration of liver 04/08/2015  . Anxiety, generalized 12/03/2014  . Combined fat and carbohydrate induced hyperlipemia 09/10/2008  . Essential (primary) hypertension 07/09/2008   Allergies  Allergen Reactions  . Codeine   . Gadolinium Derivatives Hives    The patient experienced an allergic reaction to the Magnevist (hives). The patient was taken to the Emergency Room for observation on 06[facility]12/2000 [facility]      Current Outpatient Medications:  .  amitriptyline (ELAVIL) 10 MG tablet, Take 10 mg by mouth 3 (three) times daily as needed for sleep., Disp: , Rfl:  .  gabapentin (NEURONTIN) 300 MG capsule, Take 1 capsule (300 mg total) by mouth 2 (two) times daily., Disp: 60 capsule, Rfl: 0 .  hydrOXYzine (ATARAX/VISTARIL) 25 MG tablet, Take 25 mg by mouth at bedtime as needed (sleep)., Disp: , Rfl:  .  metoprolol succinate (TOPROL-XL) 100 MG 24 hr tablet, TAKE ONE TABLET BY MOUTH ONCE DAILY TAKE  WITH  OR  IMMEDIATELY  FOLLOWING  A  MEAL*, Disp: 90 tablet, Rfl: 3 .  omega-3 acid ethyl esters (LOVAZA) 1 G capsule, Take 1 g by mouth 2 (two) times daily., Disp: , Rfl:  .  omeprazole (PRILOSEC) 40 MG capsule, Take 40 mg by mouth 2 (two) times daily., Disp: , Rfl:  .  ranitidine (ZANTAC) 150 MG tablet, Take 150 mg by mouth daily as needed for heartburn., Disp: , Rfl:  .  sucralfate (CARAFATE) 1 G tablet, Take 1 g by mouth 4 (four) times daily. , Disp: , Rfl:  .  dicyclomine (BENTYL) 10 MG capsule, Take 1 capsule (10 mg total) by mouth 4 (four) times daily., Disp: 56 capsule, Rfl: 1  Review of Systems  Constitutional: Negative.   Respiratory: Negative.   Cardiovascular: Negative.   Genitourinary: Positive for pelvic pain. Negative for discharge, scrotal swelling and testicular pain.       Groin pain   Musculoskeletal: Positive for joint pain.   Social History  Tobacco Use  . Smoking status: Former Games developer  . Smokeless tobacco: Current User    Types: Snuff  Substance Use Topics  . Alcohol use: Yes    Alcohol/week: 0.0 oz    Comment: MODERATE USE- 21 BEERS EACH WEEKEND   Objective:   BP (!) 146/84 (BP Location: Right Arm, Patient Position: Sitting, Cuff Size: Normal)   Pulse 63   Temp 98.2 F (36.8 C) (Oral)   Wt 226 lb 6.4 oz (102.7 kg)   SpO2 99%   BMI 34.42 kg/m   Physical Exam  Constitutional: He is oriented to person, place, and time. He appears well-developed and well-nourished. No  distress.  HENT:  Head: Normocephalic and atraumatic.  Right Ear: Hearing normal.  Left Ear: Hearing normal.  Nose: Nose normal.  Eyes: Conjunctivae and lids are normal. Right eye exhibits no discharge. Left eye exhibits no discharge. No scleral icterus.  Cardiovascular: Normal rate and regular rhythm.  Pulmonary/Chest: Effort normal and breath sounds normal. No respiratory distress.  Genitourinary: Rectum normal and penis normal.  Genitourinary Comments: Some enlargement of prostate.  Musculoskeletal: Normal range of motion.  Neurological: He is alert and oriented to person, place, and time.  Skin: Skin is intact. No lesion and no rash noted.  Psychiatric: He has a normal mood and affect. His speech is normal and behavior is normal. Thought content normal.    Assessment & Plan:     1. Pain, joint, pelvic region, left Onset the past 3-4 weeks. No specific injury known. Denies hematuria, diarrhea, constipation, urine hesitancy, frequency or dysuria. Dull aching discomfort worse with sitting or standing for long periods. History of renal stones in the past but this discomfort is not similar to the stone passage. Suspect groin muscle strain. Will get x-ray of hip and pelvis to rule out joint arthritis. May use Ibuprofen, Percogesic and moist heat applications. Check UA and follow up pending reports. Stool negative for occult blood on DRE. - DG HIP UNILAT WITH PELVIS 2-3 VIEWS LEFT - POCT urinalysis dipstick       Dortha Kern, PA  [facility]

## 2018-02-05 NOTE — Telephone Encounter (Signed)
Pt was just in and forgot to ask for a refill on his pain medication   Gabapentin 300 mg  He uses [facility]  Thanks [employer]

## 2018-02-07 ENCOUNTER — Telehealth: Payer: Self-pay

## 2018-02-07 DIAGNOSIS — R1032 Left lower quadrant pain: Secondary | ICD-10-CM

## 2018-02-07 NOTE — Telephone Encounter (Signed)
Patient advised. He verbalized understanding. Labs ordered and lip slipped printed at [address] for patient to come in this week for lab draw.

## 2018-02-07 NOTE — Telephone Encounter (Signed)
-----   Message from Tamsen Roers, [place] sent at 02/05/2018  2:16 PM EDT ----- No bony abnormality on x-rays. Calcifications in the area of the prostate still present. Urine was concentrated and had a small hemolyzed amount of blood. Indicates need to drink more water. No sign of infection. Should schedule fasting labs to check CMP, CBC and PSA due to LLQ discomfort. May use Ibuprofen and Percogesic (both OTC drugs) to help clear discomfort. If any changes in renal function or PSA on blood tests, may need referral back to an urologist.

## 2018-02-09 LAB — COMPREHENSIVE METABOLIC PANEL
ALBUMIN: 5 g/dL (ref 3.5–5.5)
ALT: 47 IU/L — ABNORMAL HIGH (ref 0–44)
AST: 29 IU/L (ref 0–40)
Albumin/Globulin Ratio: 1.9 (ref 1.2–2.2)
Alkaline Phosphatase: 66 IU/L (ref 39–117)
BILIRUBIN TOTAL: 0.5 mg/dL (ref 0.0–1.2)
BUN/Creatinine Ratio: 16 (ref 9–20)
BUN: 14 mg/dL (ref 6–24)
CHLORIDE: 102 mmol/L (ref 96–106)
CO2: 19 mmol/L — ABNORMAL LOW (ref 20–29)
Calcium: 9.7 mg/dL (ref 8.7–10.2)
Creatinine, Ser: 0.9 mg/dL (ref 0.76–1.27)
GFR, EST AFRICAN AMERICAN: 116 mL/min/{1.73_m2} (ref >59)
GFR, EST NON AFRICAN AMERICAN: 101 mL/min/{1.73_m2} (ref >59)
Globulin, Total: 2.7 g/dL (ref 1.5–4.5)
Glucose: 83 mg/dL (ref 65–99)
POTASSIUM: 4.3 mmol/L (ref 3.5–5.2)
SODIUM: 139 mmol/L (ref 134–144)
TOTAL PROTEIN: 7.7 g/dL (ref 6.0–8.5)

## 2018-02-09 LAB — PSA: Prostate Specific Ag, Serum: 2.2 ng/mL (ref 0.0–4.0)

## 2018-02-09 LAB — CBC WITH DIFFERENTIAL/PLATELET
Basophils Absolute: 0 10*3/uL (ref 0.0–0.2)
Basos: 0 %
EOS (ABSOLUTE): 0.3 10*3/uL (ref 0.0–0.4)
EOS: 3 %
HEMATOCRIT: 45.8 % (ref 37.5–51.0)
Hemoglobin: 15.5 g/dL (ref 13.0–17.7)
Immature Grans (Abs): 0 10*3/uL (ref 0.0–0.1)
Immature Granulocytes: 0 %
LYMPHS ABS: 3.1 10*3/uL (ref 0.7–3.1)
Lymphs: 38 %
MCH: 29.2 pg (ref 26.6–33.0)
MCHC: 33.8 g/dL (ref 31.5–35.7)
MCV: 86 fL (ref 79–97)
MONOS ABS: 0.5 10*3/uL (ref 0.1–0.9)
Monocytes: 7 %
Neutrophils Absolute: 4.2 10*3/uL (ref 1.4–7.0)
Neutrophils: 52 %
Platelets: 268 10*3/uL (ref 150–379)
RBC: 5.3 x10E6/uL (ref 4.14–5.80)
RDW: 13.7 % (ref 12.3–15.4)
WBC: 8.1 10*3/uL (ref 3.4–10.8)

## 2018-02-12 ENCOUNTER — Telehealth: Payer: Self-pay | Admitting: Family Medicine

## 2018-02-12 ENCOUNTER — Telehealth: Payer: Self-pay

## 2018-02-12 MED ORDER — ETODOLAC 200 MG PO CAPS: 200.0000 mg | ORAL_CAPSULE | Freq: Four times a day (QID) | ORAL | 1 refills | Status: DC | PRN

## 2018-02-12 NOTE — Telephone Encounter (Signed)
LMTCB

## 2018-02-12 NOTE — Telephone Encounter (Signed)
Patient advised. He states the pelvic pain is persistent and Ibuprofen is not helping. He is requesting a different pain medication or anti inflammatory medication be sent to [facility] pharmacy.   Okay to leave a voicemail if no answer when medication has been sent in.

## 2018-02-12 NOTE — Telephone Encounter (Signed)
Etodolac 200 mg qid prn pain and inflammation #30 & 1 RF. Recheck if no better in a week. Recommend hot soaks, also.

## 2018-02-12 NOTE — Telephone Encounter (Signed)
-----   Message from Jodell Cipro [place], [place] sent at 02/11/2018  4:52 PM EDT ----- PSA, CBC, liver and kidney function tests are all normal. Recheck if discomfort persists or new symptoms occur.

## 2018-02-12 NOTE — Telephone Encounter (Signed)
RX sent to [facility]. Left patient a message advising him of message below.

## 2018-02-12 NOTE — Telephone Encounter (Signed)
Pt returned call to Nikki

## 2018-07-05 ENCOUNTER — Ambulatory Visit: Payer: BLUE CROSS/BLUE SHIELD | Admitting: Family Medicine

## 2018-07-05 ENCOUNTER — Ambulatory Visit
Admission: RE | Admit: 2018-07-05 | Discharge: 2018-07-05 | Disposition: A | Discharge status: Home / Self Care | Payer: BLUE CROSS/BLUE SHIELD | Source: Ambulatory Visit | Attending: Family Medicine | Admitting: Family Medicine

## 2018-07-05 ENCOUNTER — Encounter: Payer: Self-pay | Admitting: Family Medicine

## 2018-07-05 VITALS — BP 124/84 | HR 88 | Temp 98.0°F | Wt 237.0 lb

## 2018-07-05 DIAGNOSIS — M25572 Pain in left ankle and joints of left foot: Secondary | ICD-10-CM | POA: Diagnosis present

## 2018-07-05 MED ORDER — ETODOLAC 200 MG PO CAPS: 200.0000 mg | ORAL_CAPSULE | Freq: Four times a day (QID) | ORAL | 1 refills | Status: DC | PRN

## 2018-07-05 NOTE — Patient Instructions (Signed)

## 2018-07-05 NOTE — Progress Notes (Signed)
Patient: James Henry Male    DOB: June 24, 1971   47 y.o.   MRN: [id number] Visit Date: 07/05/2018  Today's Provider: Dortha Kern, PA   Chief Complaint  Patient presents with  . Ankle Pain    Left started about 7 days ago.   Subjective:    Ankle Pain   The incident occurred 5 to 7 days ago. There was no injury mechanism. The pain is present in the left ankle. The quality of the pain is described as aching (Throbbing). The pain has been improving since onset. Associated symptoms include a loss of motion. Pertinent negatives include no inability to bear weight, loss of sensation, muscle weakness, numbness or tingling. He reports no foreign bodies present. The symptoms are aggravated by palpation, weight bearing and movement. He has tried ice, heat, NSAIDs and rest for the symptoms. The treatment provided mild relief.     126/84   Allergies  Allergen Reactions  . Codeine   . Gadolinium Derivatives Hives    The patient experienced an allergic reaction to the Magnevist (hives). The patient was taken to the Emergency Room for observation on 06/12/2000 during MRI Brain [facility]     Current Outpatient Medications:  .  gabapentin (NEURONTIN) 300 MG capsule, Take 1 capsule (300 mg total) by mouth 2 (two) times daily., Disp: 60 capsule, Rfl: 3 .  metoprolol succinate (TOPROL-XL) 100 MG 24 hr tablet, TAKE ONE TABLET BY MOUTH ONCE DAILY TAKE  WITH  OR  IMMEDIATELY  FOLLOWING  A  MEAL*, Disp: 90 tablet, Rfl: 3 .  omega-3 acid ethyl esters (LOVAZA) 1 G capsule, Take 1 g by mouth 2 (two) times daily., Disp: , Rfl:  .  omeprazole (PRILOSEC) 40 MG capsule, Take 40 mg by mouth 2 (two) times daily., Disp: , Rfl:  .  ranitidine (ZANTAC) 150 MG tablet, Take 150 mg by mouth daily as needed for heartburn., Disp: , Rfl:  .  sucralfate (CARAFATE) 1 G tablet, Take 1 g by mouth 4 (four) times daily. , Disp: , Rfl:  .  amitriptyline (ELAVIL) 10 MG tablet, Take 10 mg by mouth 3 (three) times daily as  needed for sleep., Disp: , Rfl:  .  dicyclomine (BENTYL) 10 MG capsule, Take 1 capsule (10 mg total) by mouth 4 (four) times daily., Disp: 56 capsule, Rfl: 1 .  etodolac (LODINE) 200 MG capsule, Take 1 capsule (200 mg total) by mouth 4 (four) times daily as needed (PRN pain and inflammation). (Patient not taking: Reported on 07/05/2018), Disp: 30 capsule, Rfl: 1 .  hydrOXYzine (ATARAX/VISTARIL) 25 MG tablet, Take 25 mg by mouth at bedtime as needed (sleep)., Disp: , Rfl:   Review of Systems  Constitutional: Negative.   Musculoskeletal: Positive for arthralgias and joint swelling. Negative for back pain, gait problem, myalgias, neck pain and neck stiffness.  Neurological: Negative for tingling and numbness.    Social History   Tobacco Use  . Smoking status: Former Games developer  . Smokeless tobacco: Current User    Types: Snuff  Substance Use Topics  . Alcohol use: Yes    Alcohol/week: 0.0 standard drinks    Comment: MODERATE USE- 21 BEERS EACH WEEKEND   Objective:   Wt 237 lb (107.5 kg)   BMI 36.04 kg/m  Vitals:   07/05/18 1455  Weight: 237 lb (107.5 kg)   Physical Exam  Constitutional: He is oriented to person, place, and time. He appears well-developed and well-nourished. No distress.  HENT:  Head: Normocephalic and atraumatic.  Right Ear: Hearing normal.  Left Ear: Hearing normal.  Nose: Nose normal.  Eyes: Conjunctivae and lids are normal. Right eye exhibits no discharge. Left eye exhibits no discharge. No scleral icterus.  Pulmonary/Chest: Effort normal. No respiratory distress.  Musculoskeletal: He exhibits edema and tenderness.  Very tender left ankle with 2-3+ swelling. Minimal pinkness with normal pulses. Normal pulses.  Neurological: He is alert and oriented to person, place, and time.  Skin: Skin is intact. No lesion and no rash noted.  Psychiatric: He has a normal mood and affect. His speech is normal and behavior is normal. Thought content normal.      Assessment &  Plan:     1. Acute left ankle pain Onset the past 7 days. Worse while standing at work. No known injury. Pain and sensitivity to even the light touch. Will check x-ray for bony abnormality, check labs for infection or gout and treat with Etodolac for pain and inflammation. Follow up pending reports. - CBC with Differential/Platelet - Uric acid - Sedimentation rate - DG Ankle Complete Left - etodolac (LODINE) 200 MG capsule; Take 1 capsule (200 mg total) by mouth 4 (four) times daily as needed (PRN pain and inflammation).  Dispense: 30 capsule; Refill: 1       Dortha Kern, PA  [facility]

## 2018-07-06 LAB — CBC WITH DIFFERENTIAL/PLATELET
BASOS ABS: 0 10*3/uL (ref 0.0–0.2)
BASOS: 0 %
EOS (ABSOLUTE): 0.3 10*3/uL (ref 0.0–0.4)
EOS: 3 %
Hematocrit: 43.3 % (ref 37.5–51.0)
Hemoglobin: 15 g/dL (ref 13.0–17.7)
IMMATURE GRANULOCYTES: 1 %
Immature Grans (Abs): 0 10*3/uL (ref 0.0–0.1)
LYMPHS ABS: 3.1 10*3/uL (ref 0.7–3.1)
LYMPHS: 41 %
MCH: 29.2 pg (ref 26.6–33.0)
MCHC: 34.6 g/dL (ref 31.5–35.7)
MCV: 84 fL (ref 79–97)
MONOCYTES: 10 %
Monocytes Absolute: 0.8 10*3/uL (ref 0.1–0.9)
Neutrophils Absolute: 3.5 10*3/uL (ref 1.4–7.0)
Neutrophils: 45 %
PLATELETS: 277 10*3/uL (ref 150–450)
RBC: 5.13 x10E6/uL (ref 4.14–5.80)
RDW: 13.6 % (ref 12.3–15.4)
WBC: 7.6 10*3/uL (ref 3.4–10.8)

## 2018-07-06 LAB — SEDIMENTATION RATE: SED RATE: 48 mm/h — AB (ref 0–15)

## 2018-07-06 LAB — URIC ACID: Uric Acid: 6.4 mg/dL (ref 3.7–8.6)

## 2018-07-08 ENCOUNTER — Telehealth: Payer: Self-pay

## 2018-07-08 NOTE — Telephone Encounter (Signed)
Pt advised.   Thanks,   -Laura  

## 2018-07-08 NOTE — Telephone Encounter (Signed)
-----   Message from Tamsen Roers, [place] sent at 07/07/2018  9:05 PM EDT ----- No sign of infection and uric acid level pretty good shape. Sedimentation rate elevation indicates inflammation. X-ray did not show any joint degeneration. If no better with use of Etodolac for 10 days, may need rheumatology referral for possible joint aspiration for analysis.

## 2018-08-16 ENCOUNTER — Other Ambulatory Visit: Payer: Self-pay | Admitting: Family Medicine

## 2019-05-07 ENCOUNTER — Other Ambulatory Visit: Payer: Self-pay | Admitting: Family Medicine

## 2019-06-09 ENCOUNTER — Ambulatory Visit: Payer: BC Managed Care – PPO | Admitting: Family Medicine

## 2019-06-09 ENCOUNTER — Encounter: Payer: Self-pay | Admitting: Family Medicine

## 2019-06-09 ENCOUNTER — Other Ambulatory Visit: Payer: Self-pay

## 2019-06-09 VITALS — BP 122/82 | HR 74 | Temp 97.3°F | Resp 16 | Wt 223.0 lb

## 2019-06-09 DIAGNOSIS — E782 Mixed hyperlipidemia: Secondary | ICD-10-CM | POA: Diagnosis not present

## 2019-06-09 DIAGNOSIS — K2100 Gastro-esophageal reflux disease with esophagitis, without bleeding: Secondary | ICD-10-CM

## 2019-06-09 DIAGNOSIS — Z125 Encounter for screening for malignant neoplasm of prostate: Secondary | ICD-10-CM | POA: Diagnosis not present

## 2019-06-09 DIAGNOSIS — M542 Cervicalgia: Secondary | ICD-10-CM

## 2019-06-09 DIAGNOSIS — G8929 Other chronic pain: Secondary | ICD-10-CM

## 2019-06-09 NOTE — Progress Notes (Signed)
Patient: James BlalockChristopher S Henry Male    DOB: 1970/10/28   48 y.o.   MRN: [id number] Visit Date: 06/09/2019  Today's Provider: Dortha Kernennis Caralynn Gelber, PA   Chief Complaint  Patient presents with  . Gastroesophageal Reflux   Subjective:     HPI GERD: Patient complains of chest pain. Symptoms have been present for approximately 1 month. Symptoms include belching, chest pain, heartburn and hoarseness. The patient denies choking on food and upper abdominal discomfort. Symptoms appear to be worsened by caffeine. Risk factors present for GERD include alcohol use and caffeine use. Risk factors absent for GERD are caffeine use. Studies performed so far include upper GI, result: positive for gastritis. Treatments tried so far include prescription H2 blocker: , proton pump inhibitor. Results of treatment: no change in frequency. Currently, the symptoms are mild and occur approximately 1 times per day. Past Medical History:  Diagnosis Date  . Anxiety   . GERD (gastroesophageal reflux disease)   . Hyperlipidemia   . Hypertension    No past surgical history on file. Family History  Problem Relation Age of Onset  . Coronary artery disease Father   . Cancer Father   . Depression Brother   . Heart attack Maternal Grandmother    Allergies  Allergen Reactions  . Codeine   . Gadolinium Derivatives Hives    The patient experienced an allergic reaction to the Magnevist (hives). The patient was taken to the Emergency Room for observation on 06[facility]12/2000 [facility]    Current Outpatient Medications:  .  gabapentin (NEURONTIN) 300 MG capsule, Take 1 capsule by mouth twice daily, Disp: 60 capsule, Rfl: 0 .  metoprolol succinate (TOPROL-XL) 100 MG 24 hr tablet, TAKE 1 TABLET BY MOUTH ONCE DAILY (TAKE  WITH  OR  IMMEDIATELY  FOLLOWING  A  MEAL), Disp: 90 tablet, Rfl: 3 .  omega-3 acid ethyl esters (LOVAZA) 1 G capsule, Take 1 g by mouth 2 (two) times daily., Disp: , Rfl:  .  sucralfate (CARAFATE) 1  G tablet, Take 1 g by mouth 4 (four) times daily. , Disp: , Rfl:  .  pantoprazole (PROTONIX) 40 MG tablet, Take 40 mg by mouth daily., Disp: , Rfl:   Review of Systems  Constitutional: Negative.   Respiratory: Positive for chest tightness.   Gastrointestinal: Negative for nausea and vomiting.   Social History   Tobacco Use  . Smoking status: Former Games developermoker  . Smokeless tobacco: Current User    Types: Snuff  Substance Use Topics  . Alcohol use: Yes    Alcohol/week: 0.0 standard drinks    Comment: MODERATE USE- 21 BEERS EACH WEEKEND     Objective:   BP 122/82 (BP Location: Left Arm, Patient Position: Sitting, Cuff Size: Normal)   Pulse 74   Temp (!) 97.3 F (36.3 C) (Temporal)   Resp 16   Wt 223 lb (101.2 kg)   SpO2 99%   BMI 33.91 kg/m  Vitals:   06/09/19 0820  BP: 122/82  Pulse: 74  Resp: 16  Temp: (!) 97.3 F (36.3 C)  TempSrc: Temporal  SpO2: 99%  Weight: 223 lb (101.2 kg)  Body mass index is 33.91 kg/m. Wt Readings from Last 3 Encounters:  06/09/19 223 lb (101.2 kg)  07/05/18 237 lb (107.5 kg)  02/05/18 226 lb 6.4 oz (102.7 kg)   Physical Exam Constitutional:      General: He is not in acute distress.    Appearance: He is well-developed.  HENT:     Head: Normocephalic and atraumatic.     Right Ear: Hearing normal.     Left Ear: Hearing normal.     Nose: Nose normal.  Eyes:     General: Lids are normal. No scleral icterus.       Right eye: No discharge.        Left eye: No discharge.     Conjunctiva/sclera: Conjunctivae normal.  Cardiovascular:     Rate and Rhythm: Normal rate.  Pulmonary:     Effort: Pulmonary effort is normal. No respiratory distress.  Musculoskeletal: Normal range of motion.  Skin:    Findings: No lesion or rash.  Neurological:     Mental Status: He is alert and oriented to person, place, and time.  Psychiatric:        Speech: Speech normal.        Behavior: Behavior normal.        Thought Content: Thought content normal.        Assessment & Plan    1. Gastroesophageal reflux disease with esophagitis Occasional mid chest discomfort. Continues to take Protonix that Dr. Tami Ribas (ENT) changed him to due to throat and esophageal irritation 2 months ago. Dr. Vira Agar (GI) found some chronic gastritis on EGD done 11-21-18. Should consider using the Carafate again if symptoms don't continue to improve with stopping caffeine and acidic foods. Denies nausea, vomiting, hematemesis or melena/hematochezia. Will recheck labs and should consider follow up with gastroenterologist if symptoms persisting. - CBC with Differential/Platelet - Comprehensive metabolic panel  2. Elevated cholesterol with elevated triglycerides Still taking Omega-3 fish Oil and some Red Yeast Rice without side effects except occasional burping. Will recheck labs and follow up pending reports. - Comprehensive metabolic panel - Lipid panel - TSH  3. Chronic neck pain Stable without numbness in upper extremities. No recent injury. Gabapentin 300 mg BID prn continues to control discomfort that can radiate into the right arm. Continue present medications and regular exercise program. Will get routine labs and follow up pending reports. - CBC with Differential/Platelet  4. Screening PSA (prostate specific antigen) States most UA's show some minor RBC's. Denies dysuria, gross hematuria, frequency or nocturia. Will check screening PSA. - CBC with Differential/Platelet - Comprehensive metabolic panel - PSA     Vernie Murders, PA  [facility]

## 2019-06-14 LAB — COMPREHENSIVE METABOLIC PANEL
ALT: 34 IU/L (ref 0–44)
AST: 20 IU/L (ref 0–40)
Albumin/Globulin Ratio: 2.5 — ABNORMAL HIGH (ref 1.2–2.2)
Albumin: 5 g/dL (ref 4.0–5.0)
Alkaline Phosphatase: 98 IU/L (ref 39–117)
BUN/Creatinine Ratio: 20 (ref 9–20)
BUN: 20 mg/dL (ref 6–24)
Bilirubin Total: 0.6 mg/dL (ref 0.0–1.2)
CO2: 19 mmol/L — ABNORMAL LOW (ref 20–29)
Calcium: 9.9 mg/dL (ref 8.7–10.2)
Chloride: 104 mmol/L (ref 96–106)
Creatinine, Ser: 0.98 mg/dL (ref 0.76–1.27)
GFR calc Af Amer: 104 mL/min/{1.73_m2} (ref >59)
GFR calc non Af Amer: 90 mL/min/{1.73_m2} (ref >59)
Globulin, Total: 2 g/dL (ref 1.5–4.5)
Glucose: 85 mg/dL (ref 65–99)
Potassium: 4.3 mmol/L (ref 3.5–5.2)
Sodium: 141 mmol/L (ref 134–144)
Total Protein: 7 g/dL (ref 6.0–8.5)

## 2019-06-14 LAB — CBC WITH DIFFERENTIAL/PLATELET
Basophils Absolute: 0.1 10*3/uL (ref 0.0–0.2)
Basos: 1 %
EOS (ABSOLUTE): 0.2 10*3/uL (ref 0.0–0.4)
Eos: 2 %
Hematocrit: 45.4 % (ref 37.5–51.0)
Hemoglobin: 15.3 g/dL (ref 13.0–17.7)
Immature Grans (Abs): 0 10*3/uL (ref 0.0–0.1)
Immature Granulocytes: 0 %
Lymphocytes Absolute: 3.2 10*3/uL — ABNORMAL HIGH (ref 0.7–3.1)
Lymphs: 34 %
MCH: 29 pg (ref 26.6–33.0)
MCHC: 33.7 g/dL (ref 31.5–35.7)
MCV: 86 fL (ref 79–97)
Monocytes Absolute: 0.6 10*3/uL (ref 0.1–0.9)
Monocytes: 6 %
Neutrophils Absolute: 5.3 10*3/uL (ref 1.4–7.0)
Neutrophils: 57 %
Platelets: 266 10*3/uL (ref 150–450)
RBC: 5.28 x10E6/uL (ref 4.14–5.80)
RDW: 12.7 % (ref 11.6–15.4)
WBC: 9.3 10*3/uL (ref 3.4–10.8)

## 2019-06-14 LAB — LIPID PANEL
Chol/HDL Ratio: 5.5 ratio — ABNORMAL HIGH (ref 0.0–5.0)
Cholesterol, Total: 193 mg/dL (ref 100–199)
HDL: 35 mg/dL — ABNORMAL LOW (ref >39)
LDL Chol Calc (NIH): 135 mg/dL — ABNORMAL HIGH (ref 0–99)
Triglycerides: 128 mg/dL (ref 0–149)
VLDL Cholesterol Cal: 23 mg/dL (ref 5–40)

## 2019-06-14 LAB — TSH: TSH: 2.39 u[IU]/mL (ref 0.450–4.500)

## 2019-06-14 LAB — PSA: Prostate Specific Ag, Serum: 0.6 ng/mL (ref 0.0–4.0)

## 2019-06-17 ENCOUNTER — Telehealth: Payer: Self-pay

## 2019-06-17 DIAGNOSIS — E782 Mixed hyperlipidemia: Secondary | ICD-10-CM

## 2019-06-17 MED ORDER — ATORVASTATIN CALCIUM 20 MG PO TABS: 20.0000 mg | ORAL_TABLET | Freq: Every day | ORAL | 3 refills | Status: DC

## 2019-06-17 NOTE — Telephone Encounter (Signed)
Patient was advised and agrees with starting the Atorvastatin. Medication send into pharmacy. Patient states he will return in 3 month for repeat labs.

## 2019-06-17 NOTE — Telephone Encounter (Signed)
-----   Message from [place], [place] sent at 06/16/2019  9:27 PM EDT ----- All labs essentially normal except LDL cholesterol above goal of <100 and HDL cholesterol below goal of >39. Liver enzymes are normal and should consider Atorvastatin 20 mg qd #30 & 3 RF. Recheck levels in 3 months.

## 2019-06-17 NOTE — Telephone Encounter (Signed)
[facility] 

## 2019-08-21 ENCOUNTER — Other Ambulatory Visit: Payer: Self-pay | Admitting: Family Medicine

## 2020-03-10 ENCOUNTER — Other Ambulatory Visit: Payer: Self-pay | Admitting: Student

## 2020-03-10 DIAGNOSIS — R109 Unspecified abdominal pain: Secondary | ICD-10-CM

## 2020-03-10 DIAGNOSIS — K2970 Gastritis, unspecified, without bleeding: Secondary | ICD-10-CM

## 2020-03-19 ENCOUNTER — Ambulatory Visit
Admission: RE | Admit: 2020-03-19 | Discharge: 2020-03-19 | Disposition: A | Discharge status: Home / Self Care | Payer: BC Managed Care – PPO | Source: Ambulatory Visit | Attending: Student | Admitting: Student

## 2020-03-19 ENCOUNTER — Other Ambulatory Visit: Payer: Self-pay

## 2020-03-19 DIAGNOSIS — R109 Unspecified abdominal pain: Secondary | ICD-10-CM

## 2020-03-19 DIAGNOSIS — K2970 Gastritis, unspecified, without bleeding: Secondary | ICD-10-CM

## 2020-03-19 MED ORDER — IOHEXOL 300 MG/ML  SOLN
100.0000 mL | Freq: Once | INTRAMUSCULAR | Status: AC | PRN
  Administered 2020-03-19: 100 mL via INTRAVENOUS

## 2020-03-25 ENCOUNTER — Other Ambulatory Visit: Payer: Self-pay | Admitting: Family Medicine

## 2020-03-31 ENCOUNTER — Other Ambulatory Visit: Payer: Self-pay

## 2020-03-31 ENCOUNTER — Encounter: Payer: Self-pay | Admitting: Urology

## 2020-03-31 ENCOUNTER — Ambulatory Visit: Payer: BC Managed Care – PPO | Admitting: Urology

## 2020-03-31 VITALS — BP 129/76 | HR 76 | Ht 68.0 in | Wt 239.0 lb

## 2020-03-31 DIAGNOSIS — N200 Calculus of kidney: Secondary | ICD-10-CM | POA: Insufficient documentation

## 2020-03-31 DIAGNOSIS — N529 Male erectile dysfunction, unspecified: Secondary | ICD-10-CM

## 2020-03-31 DIAGNOSIS — R319 Hematuria, unspecified: Secondary | ICD-10-CM

## 2020-03-31 MED ORDER — SILDENAFIL CITRATE 100 MG PO TABS
100.0000 mg | ORAL_TABLET | Freq: Every day | ORAL | 3 refills | Status: DC | PRN
Start: 2020-03-31 — End: 2020-08-19

## 2020-03-31 NOTE — Progress Notes (Signed)
03/09/20 9:53 AM   James Henry [birth year] [id number]  Referring provider: Marisa Hua, PA-C [address] [place],  [place] [address] Chief Complaint  Patient presents with  . Hematuria    HPI: James Henry is a 49 y.o. male who presents today at the request of Ames Coupe, PA-C for evaluation of hematuria.  -Referred for microscopic hematuria however on review of most recent urinalyses has not had clinically significant microscopic hematuria and only dipstick positive hematuria  -Denies gross hematuria -Denies bothersome lower urinary tract symptoms -CT on 03/19/20 showed small bilateral renal calculi, largest measuring 4 mm -Denies prior history of renal colic or stone passage -Bilateral renal calculi were noted on CT as far back as 2016 -History of ED and has requested Rx sildenafil which he has used in the past   PMH: Past Medical History:  Diagnosis Date  . Anxiety   . GERD (gastroesophageal reflux disease)   . Hyperlipidemia   . Hypertension     Surgical History: History reviewed. No pertinent surgical history.  Home Medications:  Allergies as of 03/31/2020      Reactions   Codeine    Gadolinium Derivatives Hives   The patient experienced an allergic reaction to the Magnevist (hives). The patient was taken to the Emergency Room for observation on 06/12/2000 during MRI [facility]      Medication List       Accurate as of March 31, 2020  9:53 AM. If you have any questions, ask your nurse or doctor.        atorvastatin 20 MG tablet Commonly known as: LIPITOR Take 1 tablet (20 mg total) by mouth daily.   etodolac 500 MG tablet Commonly known as: LODINE Take 500 mg by mouth 2 (two) times daily.   gabapentin 300 MG capsule Commonly known as: NEURONTIN Take 1 capsule by mouth twice daily   lansoprazole 30 MG capsule Commonly known as: PREVACID Take 30 mg by mouth 2 (two) times daily.   metoprolol succinate  100 MG 24 hr tablet Commonly known as: TOPROL-XL TAKE 1 TABLET BY MOUTH ONCE DAILY (TAKE  WITH  OR  IMMEDIATELY  FOLLOWING  A  MEAL)   omega-3 acid ethyl esters 1 g capsule Commonly known as: LOVAZA Take 1 g by mouth 2 (two) times daily.   pantoprazole 40 MG tablet Commonly known as: PROTONIX Take 40 mg by mouth daily.   sildenafil 100 MG tablet Commonly known as: VIAGRA Take 1 tablet (100 mg total) by mouth daily as needed for erectile dysfunction. Started by: Abbie Sons, MD   sucralfate 1 g tablet Commonly known as: CARAFATE Take 1 g by mouth 4 (four) times daily.       Allergies:  Allergies  Allergen Reactions  . Codeine   . Gadolinium Derivatives Hives    The patient experienced an allergic reaction to the Magnevist (hives). The patient was taken to the Emergency Room for observation on 06/12/2000 during MRI Brain [facility]    Family History: Family History  Problem Relation Age of Onset  . Coronary artery disease Father   . Cancer Father   . Depression Brother   . Heart attack Maternal Grandmother     Social History:  reports that he has quit smoking. His smokeless tobacco use includes snuff. He reports current alcohol use. He reports that he does not use drugs.   Physical Exam: BP 129/76   Pulse 76   Ht 5\' 8"  (  1.727 m)   Wt 239 lb (108.4 kg)   BMI 36.34 kg/m   Constitutional:  Alert and oriented, No acute distress. HEENT:  AT, moist mucus membranes.  Trachea midline, no masses. Cardiovascular: No clubbing, cyanosis, or edema. Respiratory: Normal respiratory effort, no increased work of breathing. Skin: No rashes, bruises or suspicious lesions. Neurologic: Grossly intact, no focal deficits, moving all 4 extremities. Psychiatric: Normal mood and affect.  Laboratory Data:   Lab Results  Component Value Date   CREATININE 0.98 06/13/2019    Pertinent Imaging: Images were personally reviewed  CLINICAL DATA:  Right flank pain for 9 months.  Microscopic hematuria. Nephrolithiasis.  EXAM: CT ABDOMEN AND PELVIS WITH CONTRAST  TECHNIQUE: Multidetector CT imaging of the abdomen and pelvis was performed using the standard protocol following bolus administration of intravenous contrast.  CONTRAST:  OMNIPAQUE IOHEXOL 300 MG/ML  SOLN  COMPARISON:  04/27/2017  FINDINGS: Lower Chest: No acute findings.  Hepatobiliary: No hepatic masses identified. Mild-to-moderate diffuse hepatic steatosis. Gallbladder is unremarkable. No evidence of biliary ductal dilatation.  Pancreas:  No mass or inflammatory changes.  Spleen: Within normal limits in size and appearance.  Adrenals/Urinary Tract: Stable tiny bilateral renal cysts. No masses identified. Small calculi are again seen in both kidneys, largest in lower pole of left kidney measuring 4 mm. No evidence of ureteral calculi or hydronephrosis. Unremarkable unopacified urinary bladder.  Stomach/Bowel: No evidence of obstruction, inflammatory process or abnormal fluid collections. Diverticulosis is seen mainly involving the descending and sigmoid colon, however there is no evidence of diverticulitis.  Vascular/Lymphatic: No pathologically enlarged lymph nodes. No abdominal aortic aneurysm.  Reproductive:  No mass or other significant abnormality.  Other:  None.  Musculoskeletal:  No suspicious bone lesions identified.  IMPRESSION: 1. Bilateral nephrolithiasis. No evidence of ureteral calculi, hydronephrosis, or other acute findings. 2. Colonic diverticulosis, without radiographic evidence of diverticulitis. 3. Hepatic steatosis.   Electronically Signed   By: Danae Orleans M.D.   On: 03/19/2020 10:56.  Assessment & Plan:    1. Dipstick positive hematuria Based on American Urological Association practice guidelines asymptomatic microhematuria [facility]) is defined as three or greater red blood cells per high powered field on a properly collected  urinary specimen in the absence of an obvious benign cause. A positive dipstick does not define AMH, and evaluation should be based solely on findings from microscopic examination of urinary sediment and not on a dipstick reading.  2. Bilateral nephrolithiasis -Discussed metabolic evaluation, however he does not want to pursue. -Recommended 1 year follow up with a KUB  3.  Erectile dysfunction -Rx 100 mg viagra sent to pharmacy    [facility] [address] [place], [place] [address] [phone]  I, Francina Ames Peace, am acting as a Neurosurgeon for Dr. Lorin Picket C. Bear Osten.  I have reviewed the above documentation for accuracy and completeness, and I agree with the above.   Riki Altes, MD

## 2020-04-01 LAB — MICROSCOPIC EXAMINATION: Bacteria, UA: NONE SEEN

## 2020-04-01 LAB — URINALYSIS, COMPLETE
Bilirubin, UA: NEGATIVE
Glucose, UA: NEGATIVE
Ketones, UA: NEGATIVE
Leukocytes,UA: NEGATIVE
Nitrite, UA: NEGATIVE
Protein,UA: NEGATIVE
Specific Gravity, UA: <1.005 — ABNORMAL LOW (ref 1.005–1.030)
Urobilinogen, Ur: 0.2 mg/dL (ref 0.2–1.0)
pH, UA: 6 (ref 5.0–7.5)

## 2020-04-08 NOTE — Progress Notes (Signed)
Complete physical exam   Patient: James Henry   DOB: 1971/08/26   49 y.o. Male  MRN: [id number] Visit Date: 04/09/2020  Today's healthcare provider: Dortha Kern, PA   Chief Complaint  Patient presents with  . Annual Exam   Subjective    James Henry is a 49 y.o. male who presents today for a complete physical exam.  He reports consuming a general diet. The patient has a physically strenuous job, but has no regular exercise apart from work.  He generally feels well. He reports sleeping well. He does have additional problems to discuss today.    Pt reports he stated back taking atorvastatin about two weeks ago.  He states he does not tolerate it.    Past Medical History:  Diagnosis Date  . Anxiety   . GERD (gastroesophageal reflux disease)   . Hyperlipidemia   . Hypertension    History reviewed. No pertinent surgical history.   Social History   Socioeconomic History  . Marital status: Married    Spouse name: Not on file  . Number of children: Not on file  . Years of education: Not on file  . Highest education level: Not on file  Occupational History  . Not on file  Tobacco Use  . Smoking status: Current Some Day Smoker  . Smokeless tobacco: Former Neurosurgeon    Types: Snuff  Vaping Use  . Vaping Use: Never used  Substance and Sexual Activity  . Alcohol use: Yes    Alcohol/week: 0.0 standard drinks    Comment: MODERATE USE- 21 BEERS EACH WEEKEND  . Drug use: No  . Sexual activity: Not on file  Other Topics Concern  . Not on file  Social History Narrative  . Not on file   Social Determinants of Health   Financial Resource Strain:   . Difficulty of Paying Living Expenses:   Food Insecurity:   . Worried About Programme researcher, broadcasting/film/video in the Last Year:   . Barista in the Last Year:   Transportation Needs:   . Freight forwarder (Medical):   Marland Kitchen Lack of Transportation (Non-Medical):   Physical Activity:   . Days of Exercise per Week:    . Minutes of Exercise per Session:   Stress:   . Feeling of Stress :   Social Connections:   . Frequency of Communication with Friends and Family:   . Frequency of Social Gatherings with Friends and Family:   . Attends Religious Services:   . Active Member of Clubs or Organizations:   . Attends Banker Meetings:   Marland Kitchen Marital Status:   Intimate Partner Violence:   . Fear of Current or Ex-Partner:   . Emotionally Abused:   Marland Kitchen Physically Abused:   . Sexually Abused:    Family Status  Relation Name Status  . Mother  Alive  . Father  Deceased  . Sister  Alive  . Brother  Alive  . MGM  Deceased   Family History  Problem Relation Age of Onset  . Coronary artery disease Father   . Cancer Father   . Leukemia Father   . Depression Brother   . Heart attack Maternal Grandmother    Allergies  Allergen Reactions  . Codeine   . Gadolinium Derivatives Hives    The patient experienced an allergic reaction to the Magnevist (hives). The patient was taken to the Emergency Room for observation on 06/12/2000 during MRI Brain [facility]  Patient Care Team: Edda Orea, Jodell Cipro, PA as PCP - General (Physician Assistant)   Medications: Outpatient Medications Prior to Visit  Medication Sig  . atorvastatin (LIPITOR) 20 MG tablet Take 1 tablet (20 mg total) by mouth daily.  Marland Kitchen etodolac (LODINE) 500 MG tablet Take 500 mg by mouth 2 (two) times daily.  . metoprolol succinate (TOPROL-XL) 100 MG 24 hr tablet TAKE 1 TABLET BY MOUTH ONCE DAILY (TAKE  WITH  OR  IMMEDIATELY  FOLLOWING  A  MEAL)  . omega-3 acid ethyl esters (LOVAZA) 1 G capsule Take 1 g by mouth 2 (two) times daily.  . sildenafil (VIAGRA) 100 MG tablet Take 1 tablet (100 mg total) by mouth daily as needed for erectile dysfunction.  . sucralfate (CARAFATE) 1 G tablet Take 1 g by mouth 4 (four) times daily.   Marland Kitchen gabapentin (NEURONTIN) 300 MG capsule Take 1 capsule by mouth twice daily (Patient not taking: Reported on 03/31/2020)  .  lansoprazole (PREVACID) 30 MG capsule Take 30 mg by mouth 2 (two) times daily. (Patient not taking: Reported on 04/09/2020)  . pantoprazole (PROTONIX) 40 MG tablet Take 40 mg by mouth daily.   No facility-administered medications prior to visit.    Review of Systems  Constitutional: Negative.   HENT: Negative.   Eyes: Negative.   Respiratory: Negative.   Cardiovascular: Negative.   Gastrointestinal: Negative.   Endocrine: Negative.   Genitourinary: Negative.   Musculoskeletal: Negative.   Skin: Negative.   Allergic/Immunologic: Negative.   Neurological: Negative.   Hematological: Negative.   Psychiatric/Behavioral: Negative.        Objective    BP 126/74 (BP Location: Right Arm, Patient Position: Sitting, Cuff Size: Large)   Pulse 80   Temp (!) 97.5 F (36.4 C) (Temporal)   Ht 5\' 8"  (1.727 m)   Wt 240 lb (108.9 kg)   SpO2 98%   BMI 36.49 kg/m    Wt Readings from Last 3 Encounters:  04/09/20 240 lb (108.9 kg)  03/31/20 239 lb (108.4 kg)  06/09/19 223 lb (101.2 kg)   Physical Exam Constitutional:      Appearance: He is well-developed.  HENT:     Head: Normocephalic and atraumatic.     Right Ear: External ear normal.     Left Ear: External ear normal.     Nose: Nose normal.  Eyes:     General:        Right eye: No discharge.     Conjunctiva/sclera: Conjunctivae normal.     Pupils: Pupils are equal, round, and reactive to light.  Neck:     Thyroid: No thyromegaly.     Trachea: No tracheal deviation.  Cardiovascular:     Rate and Rhythm: Normal rate and regular rhythm.     Heart sounds: Normal heart sounds. No murmur heard.   Pulmonary:     Effort: Pulmonary effort is normal. No respiratory distress.     Breath sounds: Normal breath sounds. No wheezing or rales.  Chest:     Chest wall: No tenderness.  Abdominal:     General: There is no distension.     Palpations: Abdomen is soft. There is no mass.     Tenderness: There is no abdominal tenderness. There  is no guarding or rebound.  Genitourinary:    Penis: Normal.      Testes: Normal.     Prostate: Normal.     Rectum: Normal. Guaiac result negative.  Musculoskeletal:  General: No tenderness. Normal range of motion.     Cervical back: Normal range of motion and neck supple.  Lymphadenopathy:     Cervical: No cervical adenopathy.  Skin:    General: Skin is warm and dry.     Findings: No erythema or rash.  Neurological:     Mental Status: He is alert and oriented to person, place, and time.     Cranial Nerves: No cranial nerve deficit.     Motor: No abnormal muscle tone.     Coordination: Coordination normal.     Deep Tendon Reflexes: Reflexes are normal and symmetric. Reflexes normal.  Psychiatric:        Behavior: Behavior normal.        Thought Content: Thought content normal.        Judgment: Judgment normal.      Last depression screening scores No flowsheet data found. Denies depressive symptoms. Last fall risk screening No flowsheet data found. Last Audit-C alcohol use screening No flowsheet data found. A score of 3 or more in women, and 4 or more in men indicates increased risk for alcohol abuse, EXCEPT if all of the points are from question 1   No results found for any visits on 04/09/20.  Assessment & Plan    Routine Health Maintenance and Physical Exam  Exercise Activities and Dietary recommendations Goals   Recommend exercise 30-40 minutes 3-4 days a week and work on low fat diet.     Immunization History  Administered Date(s) Administered  . Hepatitis B, adult 06/12/2014  . Tdap 06/06/2013    Health Maintenance  Topic Date Due  . Hepatitis C Screening  Never done  . COVID-19 Vaccine (1) Never done  . HIV Screening  Never done  . COLONOSCOPY  Never done  . INFLUENZA VACCINE  05/09/2020  . TETANUS/TDAP  06/07/2023    Discussed health benefits of physical activity, and encouraged him to engage in regular exercise appropriate for his age and  condition.  1. Annual physical exam Fair general health. Scheduled for colonoscopy in August with gastroenterologist that follows his gastritis, hepatic steatosis and history of adenomatous colon polyps (Dr. Marney Doctor). History of hematuria evaluated by Dr. Lonna Cobb (urologist) but no pathology other than asymptomatic kidney stones. Given anticipatory counseling and recheck routine labs. - CBC with Differential/Platelet - Comprehensive metabolic panel - Lipid panel - PSA - TSH  2. Pure hypercholesterolemia Restarted Atorvastatin at 20 mg BID a week or two ago. Felt he was getting diarrhea with its use. Continue low fat diet and recheck labs. - Comprehensive metabolic panel - Lipid panel - TSH  3. Essential (primary) hypertension Good BP control and tolerating Metoprolol Succinate 100 mg qd. Recheck routine labs. No chest pains or palpitations. - CBC with Differential/Platelet - Comprehensive metabolic panel - Lipid panel - TSH  4. HIV screening declined States he was tested at age 10.  5. Need for hepatitis C screening test Declines today. States it has been tested in the past year or two with history of hepatic steatosis.  6. Screening PSA (prostate specific antigen) No significant nocturia. Normal DRE and negative hemoccult. Check PSA. - PSA   No follow-ups on file.        Dortha Kern, PA  [facility] [phone] (phone) [phone] (fax)  [facility]

## 2020-04-09 ENCOUNTER — Encounter: Payer: Self-pay | Admitting: Family Medicine

## 2020-04-09 ENCOUNTER — Ambulatory Visit (INDEPENDENT_AMBULATORY_CARE_PROVIDER_SITE_OTHER): Payer: BC Managed Care – PPO | Admitting: Family Medicine

## 2020-04-09 ENCOUNTER — Other Ambulatory Visit: Payer: Self-pay

## 2020-04-09 VITALS — BP 126/74 | HR 80 | Temp 97.5°F | Ht 68.0 in | Wt 240.0 lb

## 2020-04-09 DIAGNOSIS — E7800 Pure hypercholesterolemia, unspecified: Secondary | ICD-10-CM | POA: Diagnosis not present

## 2020-04-09 DIAGNOSIS — I10 Essential (primary) hypertension: Secondary | ICD-10-CM

## 2020-04-09 DIAGNOSIS — Z5320 Procedure and treatment not carried out because of patient's decision for unspecified reasons: Secondary | ICD-10-CM

## 2020-04-09 DIAGNOSIS — Z0000 Encounter for general adult medical examination without abnormal findings: Secondary | ICD-10-CM

## 2020-04-09 DIAGNOSIS — Z1159 Encounter for screening for other viral diseases: Secondary | ICD-10-CM

## 2020-04-09 DIAGNOSIS — Z125 Encounter for screening for malignant neoplasm of prostate: Secondary | ICD-10-CM

## 2020-04-09 MED ORDER — METOPROLOL SUCCINATE ER 100 MG PO TB24: ORAL_TABLET | ORAL | 3 refills | Status: DC

## 2020-04-09 NOTE — Patient Instructions (Signed)

## 2020-04-10 LAB — CBC WITH DIFFERENTIAL/PLATELET
Basophils Absolute: 0.1 10*3/uL (ref 0.0–0.2)
Basos: 1 %
EOS (ABSOLUTE): 0.3 10*3/uL (ref 0.0–0.4)
Eos: 3 %
Hematocrit: 46.5 % (ref 37.5–51.0)
Hemoglobin: 16.4 g/dL (ref 13.0–17.7)
Immature Grans (Abs): 0.1 10*3/uL (ref 0.0–0.1)
Immature Granulocytes: 1 %
Lymphocytes Absolute: 3 10*3/uL (ref 0.7–3.1)
Lymphs: 36 %
MCH: 30.7 pg (ref 26.6–33.0)
MCHC: 35.3 g/dL (ref 31.5–35.7)
MCV: 87 fL (ref 79–97)
Monocytes Absolute: 0.7 10*3/uL (ref 0.1–0.9)
Monocytes: 8 %
Neutrophils Absolute: 4.3 10*3/uL (ref 1.4–7.0)
Neutrophils: 51 %
Platelets: 239 10*3/uL (ref 150–450)
RBC: 5.35 x10E6/uL (ref 4.14–5.80)
RDW: 13.8 % (ref 11.6–15.4)
WBC: 8.3 10*3/uL (ref 3.4–10.8)

## 2020-04-10 LAB — TSH: TSH: 2.26 u[IU]/mL (ref 0.450–4.500)

## 2020-04-10 LAB — PSA: Prostate Specific Ag, Serum: 0.5 ng/mL (ref 0.0–4.0)

## 2020-04-10 LAB — LIPID PANEL
Chol/HDL Ratio: 5.7 ratio — ABNORMAL HIGH (ref 0.0–5.0)
Cholesterol, Total: 183 mg/dL (ref 100–199)
HDL: 32 mg/dL — ABNORMAL LOW (ref >39)
LDL Chol Calc (NIH): 113 mg/dL — ABNORMAL HIGH (ref 0–99)
Triglycerides: 219 mg/dL — ABNORMAL HIGH (ref 0–149)
VLDL Cholesterol Cal: 38 mg/dL (ref 5–40)

## 2020-04-10 LAB — COMPREHENSIVE METABOLIC PANEL
ALT: 55 IU/L — ABNORMAL HIGH (ref 0–44)
AST: 40 IU/L (ref 0–40)
Albumin/Globulin Ratio: 1.9 (ref 1.2–2.2)
Albumin: 4.8 g/dL (ref 4.0–5.0)
Alkaline Phosphatase: 85 IU/L (ref 48–121)
BUN/Creatinine Ratio: 16 (ref 9–20)
BUN: 14 mg/dL (ref 6–24)
Bilirubin Total: 0.5 mg/dL (ref 0.0–1.2)
CO2: 22 mmol/L (ref 20–29)
Calcium: 9.6 mg/dL (ref 8.7–10.2)
Chloride: 103 mmol/L (ref 96–106)
Creatinine, Ser: 0.9 mg/dL (ref 0.76–1.27)
GFR calc Af Amer: 115 mL/min/{1.73_m2} (ref >59)
GFR calc non Af Amer: 99 mL/min/{1.73_m2} (ref >59)
Globulin, Total: 2.5 g/dL (ref 1.5–4.5)
Glucose: 95 mg/dL (ref 65–99)
Potassium: 3.9 mmol/L (ref 3.5–5.2)
Sodium: 139 mmol/L (ref 134–144)
Total Protein: 7.3 g/dL (ref 6.0–8.5)

## 2020-04-13 ENCOUNTER — Telehealth: Payer: Self-pay

## 2020-04-13 NOTE — Telephone Encounter (Signed)
Patient advised of lab results

## 2020-04-13 NOTE — Telephone Encounter (Signed)
-----   Message from Tamsen Roers, [place] sent at 04/13/2020  8:36 AM EDT ----- One liver enzyme elevated, triglycerides elevated and HDL ("good") cholesterol is low. This had raised the risk ratio to 5.7. This is probably due to fats in diet and heavy alcohol intake (beers) on the weekends. Reducing intake to no more than 2 beers in any 24 hours, low fat diet and increase in water intake will probably bring these levels back in line. Should recheck levels to assess progress in 4 months.

## 2020-05-09 HISTORY — PX: COLONOSCOPY: SHX174

## 2020-05-11 LAB — HM COLONOSCOPY

## 2020-06-10 ENCOUNTER — Ambulatory Visit: Payer: Self-pay

## 2020-06-10 NOTE — Telephone Encounter (Signed)
Patient called and says he's been having off and on chest pain for almost 2 months. He says the pain is on the left side at the first rib near the armpit. He says the pain is a 4. He says it's like he has acid reflux because it hurts more when he drinks or eats something. He says he was scoped and found he has GERD and he's been taking his medications, no missed doses. He says he's hurting now. I advised no availability with PCP until Tuesday, 06/15/20. He says he will take that appointment, he doesn't want to see anyone else. Appointment scheduled for Tuesday, 06/15/20 at 0840 with Dortha Kern, PA, care advice given, he verbalized understanding.   Reason for Disposition . [1] Chest pain lasts > 5 minutes AND [2] occurred > 3 days ago (72 hours) AND [3] NO chest pain or cardiac symptoms now  Answer Assessment - Initial Assessment Questions 1. LOCATION: "Where does it hurt?"       At the top on the left side at the first rib near the armpit 2. RADIATION: "Does the pain go anywhere else?" (e.g., into neck, jaw, arms, back)     No 3. ONSET: "When did the chest pain begin?" (Minutes, hours or days)      Almost 2 months 4. PATTERN "Does the pain come and go, or has it been constant since it started?"  "Does it get worse with exertion?"      Comes and goes 5. DURATION: "How long does it last" (e.g., seconds, minutes, hours)     Most of the day 6. SEVERITY: "How bad is the pain?"  (e.g., Scale 1-10; mild, moderate, or severe)    - MILD (1-3): doesn't interfere with normal activities     - MODERATE (4-7): interferes with normal activities or awakens from sleep    - SEVERE (8-10): excruciating pain, unable to do any normal activities       4 7. CARDIAC RISK FACTORS: "Do you have any history of heart problems or risk factors for heart disease?" (e.g., angina, prior heart attack; diabetes, high blood pressure, high cholesterol, smoker, or strong family history of heart disease)     Family history of heart  disease, smoker 8. PULMONARY RISK FACTORS: "Do you have any history of lung disease?"  (e.g., blood clots in lung, asthma, emphysema, birth control pills)     No 9. CAUSE: "What do you think is causing the chest pain?"     It seems like when I eat or drink, it's more painful 10. OTHER SYMPTOMS: "Do you have any other symptoms?" (e.g., dizziness, nausea, vomiting, sweating, fever, difficulty breathing, cough)      No 11. PREGNANCY: "Is there any chance you are pregnant?" "When was your last menstrual period?"      N/A  Protocols used: CHEST PAIN-A-AH

## 2020-06-15 ENCOUNTER — Ambulatory Visit: Payer: Self-pay | Admitting: Family Medicine

## 2020-07-19 ENCOUNTER — Other Ambulatory Visit: Payer: Self-pay | Admitting: Family Medicine

## 2020-07-19 DIAGNOSIS — E782 Mixed hyperlipidemia: Secondary | ICD-10-CM

## 2020-08-19 ENCOUNTER — Ambulatory Visit
Admission: RE | Admit: 2020-08-19 | Discharge: 2020-08-19 | Disposition: A | Discharge status: Home / Self Care | Payer: BC Managed Care – PPO | Source: Ambulatory Visit | Attending: Family Medicine | Admitting: Family Medicine

## 2020-08-19 ENCOUNTER — Other Ambulatory Visit: Payer: Self-pay

## 2020-08-19 ENCOUNTER — Ambulatory Visit
Admission: RE | Admit: 2020-08-19 | Discharge: 2020-08-19 | Disposition: A | Discharge status: Home / Self Care | Payer: BC Managed Care – PPO | Attending: Family Medicine | Admitting: Family Medicine

## 2020-08-19 ENCOUNTER — Ambulatory Visit: Payer: BC Managed Care – PPO | Admitting: Family Medicine

## 2020-08-19 ENCOUNTER — Encounter: Payer: Self-pay | Admitting: Family Medicine

## 2020-08-19 VITALS — BP 129/83 | HR 70 | Temp 98.1°F | Resp 16 | Wt 238.0 lb

## 2020-08-19 DIAGNOSIS — R0789 Other chest pain: Secondary | ICD-10-CM | POA: Diagnosis not present

## 2020-08-19 DIAGNOSIS — I10 Essential (primary) hypertension: Secondary | ICD-10-CM

## 2020-08-19 DIAGNOSIS — N529 Male erectile dysfunction, unspecified: Secondary | ICD-10-CM

## 2020-08-19 DIAGNOSIS — E782 Mixed hyperlipidemia: Secondary | ICD-10-CM

## 2020-08-19 MED ORDER — SIMVASTATIN 40 MG PO TABS: 40.0000 mg | ORAL_TABLET | Freq: Every day | ORAL | 3 refills | Status: DC

## 2020-08-19 MED ORDER — SILDENAFIL CITRATE 100 MG PO TABS: 100.0000 mg | ORAL_TABLET | Freq: Every day | ORAL | 3 refills | Status: DC | PRN

## 2020-08-19 NOTE — Progress Notes (Signed)
Established patient visit   Patient: James Henry   DOB: 1970/12/22   49 y.o. Male  MRN: [id number] Visit Date: 08/19/2020  Today's healthcare provider: Dortha Kern, PA   Chief Complaint  Patient presents with  . Hyperlipidemia   Subjective    HPI  Lipid/Cholesterol, Follow-up  Last lipid panel Other pertinent labs  Lab Results  Component Value Date   CHOL 183 04/09/2020   HDL 32 (L) 04/09/2020   LDLCALC 113 (H) 04/09/2020   TRIG 219 (H) 04/09/2020   CHOLHDL 5.7 (H) 04/09/2020   Lab Results  Component Value Date   ALT 55 (H) 04/09/2020   AST 40 04/09/2020   PLT 239 04/09/2020   TSH 2.260 04/09/2020     He was last seen for this 4 months ago.  Management since that visit includes ordering labs which showed one liver enzyme elevated, triglycerides elevated and HDL ("good") cholesterol was  Low. Patient was advised patient to reduce alcohol intake to no more than 2 beers in any 24 hours, low fat diet and increase in water intake.  He reports poor compliance with treatment. He is having side effects (acid reflux).   Symptoms: No chest pain No chest pressure/discomfort  No dyspnea No lower extremity edema  No numbness or tingling of extremity No orthopnea  No palpitations No paroxysmal nocturnal dyspnea  No speech difficulty No syncope   Current diet: in general, an "unhealthy" diet Current exercise: none  The 10-year ASCVD risk score Denman George DC Montez Hageman., et al., 2013) is: 5.8%  ---------------------------------------------------------------------------------------------------  Past Medical History:  Diagnosis Date  . Anxiety   . GERD (gastroesophageal reflux disease)   . Hyperlipidemia   . Hypertension    No past surgical history on file. Social History   Tobacco Use  . Smoking status: Former Games developer  . Smokeless tobacco: Current User    Types: Snuff  Vaping Use  . Vaping Use: Never used  Substance Use Topics  . Alcohol use: Yes     Alcohol/week: 0.0 standard drinks    Comment: MODERATE USE- 21 BEERS EACH WEEKEND  . Drug use: No   Family History  Problem Relation Age of Onset  . Coronary artery disease Father   . Cancer Father   . Leukemia Father   . Depression Brother   . Heart attack Maternal Grandmother    Allergies  Allergen Reactions  . Codeine   . Gadolinium Derivatives Hives    The patient experienced an allergic reaction to the Magnevist (hives). The patient was taken to the Emergency Room for observation on 06/12/2000 during MRI Brain [facility]       Medications: Outpatient Medications Prior to Visit  Medication Sig  . atorvastatin (LIPITOR) 20 MG tablet Take 1 tablet by mouth once daily  . gabapentin (NEURONTIN) 300 MG capsule Take 1 capsule by mouth twice daily  . lansoprazole (PREVACID) 30 MG capsule Take 30 mg by mouth 2 (two) times daily.   . metoprolol succinate (TOPROL-XL) 100 MG 24 hr tablet TAKE 1 TABLET BY MOUTH ONCE DAILY (TAKE  WITH  OR  IMMEDIATELY  FOLLOWING  A  MEAL)  . omega-3 acid ethyl esters (LOVAZA) 1 G capsule Take 1 g by mouth 2 (two) times daily.  . sildenafil (VIAGRA) 100 MG tablet Take 1 tablet (100 mg total) by mouth daily as needed for erectile dysfunction.  . sucralfate (CARAFATE) 1 G tablet Take 1 g by mouth 4 (four) times daily.   . [DISCONTINUED]  etodolac (LODINE) 500 MG tablet Take 500 mg by mouth 2 (two) times daily. (Patient not taking: Reported on 08/19/2020)  . [DISCONTINUED] pantoprazole (PROTONIX) 40 MG tablet Take 40 mg by mouth daily. (Patient not taking: Reported on 08/19/2020)   No facility-administered medications prior to visit.    Review of Systems  Constitutional: Negative for appetite change, chills and fever.  Respiratory: Negative for chest tightness, shortness of breath and wheezing.   Cardiovascular: Negative for chest pain and palpitations.  Gastrointestinal: Negative for abdominal pain, nausea and vomiting.       Acid reflux      Objective      BP 129/83 (BP Location: Right Arm, Patient Position: Sitting, Cuff Size: Large)   Pulse 70   Temp 98.1 F (36.7 C) (Oral)   Resp 16   Wt 238 lb (108 kg)   BMI 36.19 kg/m  BP Readings from Last 3 Encounters:  08/19/20 129/83  04/09/20 126/74  03/31/20 129/76   Wt Readings from Last 3 Encounters:  08/19/20 238 lb (108 kg)  04/09/20 240 lb (108.9 kg)  03/31/20 239 lb (108.4 kg)   Physical Exam Constitutional:      General: He is not in acute distress.    Appearance: He is well-developed.  HENT:     Head: Normocephalic and atraumatic.     Right Ear: Hearing normal.     Left Ear: Hearing normal.     Nose: Nose normal.  Eyes:     General: Lids are normal. No scleral icterus.       Right eye: No discharge.        Left eye: No discharge.     Conjunctiva/sclera: Conjunctivae normal.  Cardiovascular:     Rate and Rhythm: Normal rate and regular rhythm.     Heart sounds: Normal heart sounds.  Pulmonary:     Effort: Pulmonary effort is normal. No respiratory distress.     Breath sounds: Normal breath sounds.  Abdominal:     General: Bowel sounds are normal.     Palpations: Abdomen is soft.  Musculoskeletal:        General: Normal range of motion.     Cervical back: Normal range of motion and neck supple.  Skin:    Findings: No lesion or rash.  Neurological:     Mental Status: He is alert and oriented to person, place, and time.  Psychiatric:        Speech: Speech normal.        Behavior: Behavior normal.        Thought Content: Thought content normal.       No results found for any visits on 08/19/20.  Assessment & Plan     1. Combined fat and carbohydrate induced hyperlipemia Feels the Lipitor has been causing more GERD symptoms and requests change to Zocor. Recheck labs and follow up pending reports. - CBC with Differential/Platelet - Comprehensive metabolic panel - Lipid panel - TSH - simvastatin (ZOCOR) 40 MG tablet; Take 1 tablet (40 mg total) by mouth at  bedtime.  Dispense: 90 tablet; Refill: 3  2. Essential (primary) hypertension Well controlled by Metoprolol 100 mg qd without side effects. Check labs. - CBC with Differential/Platelet - Comprehensive metabolic panel - Lipid panel - TSH  3. Erectile dysfunction, unspecified erectile dysfunction type Good response from Viagra on the weekends. No hematuria or dysuria. Requests refill. Will get labs. - CBC with Differential/Platelet - Comprehensive metabolic panel - sildenafil (VIAGRA) 100 MG tablet; Take  1 tablet (100 mg total) by mouth daily as needed for erectile dysfunction.  Dispense: 30 tablet; Refill: 3  4. Chest discomfort No dyspnea or pain with exertion. No longer smoking. History of GERD followed by gastroenterologist. Had upper endoscopy and colonoscopy in August 2021. No PUD or polyps this time. - CBC with Differential/Platelet - DG Chest 2 View   No follow-ups on file.      Haywood Pao, PA, have reviewed all documentation for this visit. The documentation on 08/19/20 for the exam, diagnosis, procedures, and orders are all accurate and complete.    Dortha Kern, PA  [facility] [phone] (phone) [phone] (fax)  [facility]

## 2020-08-20 ENCOUNTER — Telehealth: Payer: Self-pay

## 2020-08-20 LAB — CBC WITH DIFFERENTIAL/PLATELET
Basophils Absolute: 0.1 10*3/uL (ref 0.0–0.2)
Basos: 1 %
EOS (ABSOLUTE): 0.2 10*3/uL (ref 0.0–0.4)
Eos: 2 %
Hematocrit: 46.7 % (ref 37.5–51.0)
Hemoglobin: 16.2 g/dL (ref 13.0–17.7)
Immature Grans (Abs): 0 10*3/uL (ref 0.0–0.1)
Immature Granulocytes: 0 %
Lymphocytes Absolute: 3.3 10*3/uL — ABNORMAL HIGH (ref 0.7–3.1)
Lymphs: 37 %
MCH: 29.9 pg (ref 26.6–33.0)
MCHC: 34.7 g/dL (ref 31.5–35.7)
MCV: 86 fL (ref 79–97)
Monocytes Absolute: 0.6 10*3/uL (ref 0.1–0.9)
Monocytes: 7 %
Neutrophils Absolute: 4.8 10*3/uL (ref 1.4–7.0)
Neutrophils: 53 %
Platelets: 298 10*3/uL (ref 150–450)
RBC: 5.41 x10E6/uL (ref 4.14–5.80)
RDW: 13.1 % (ref 11.6–15.4)
WBC: 9.1 10*3/uL (ref 3.4–10.8)

## 2020-08-20 LAB — LIPID PANEL
Chol/HDL Ratio: 5.3 ratio — ABNORMAL HIGH (ref 0.0–5.0)
Cholesterol, Total: 180 mg/dL (ref 100–199)
HDL: 34 mg/dL — ABNORMAL LOW (ref >39)
LDL Chol Calc (NIH): 107 mg/dL — ABNORMAL HIGH (ref 0–99)
Triglycerides: 226 mg/dL — ABNORMAL HIGH (ref 0–149)
VLDL Cholesterol Cal: 39 mg/dL (ref 5–40)

## 2020-08-20 LAB — COMPREHENSIVE METABOLIC PANEL
ALT: 56 IU/L — ABNORMAL HIGH (ref 0–44)
AST: 34 IU/L (ref 0–40)
Albumin/Globulin Ratio: 1.9 (ref 1.2–2.2)
Albumin: 5 g/dL (ref 4.0–5.0)
Alkaline Phosphatase: 93 IU/L (ref 44–121)
BUN/Creatinine Ratio: 15 (ref 9–20)
BUN: 15 mg/dL (ref 6–24)
Bilirubin Total: 0.6 mg/dL (ref 0.0–1.2)
CO2: 20 mmol/L (ref 20–29)
Calcium: 9.9 mg/dL (ref 8.7–10.2)
Chloride: 103 mmol/L (ref 96–106)
Creatinine, Ser: 0.98 mg/dL (ref 0.76–1.27)
GFR calc Af Amer: 103 mL/min/{1.73_m2} (ref >59)
GFR calc non Af Amer: 90 mL/min/{1.73_m2} (ref >59)
Globulin, Total: 2.6 g/dL (ref 1.5–4.5)
Glucose: 96 mg/dL (ref 65–99)
Potassium: 4 mmol/L (ref 3.5–5.2)
Sodium: 140 mmol/L (ref 134–144)
Total Protein: 7.6 g/dL (ref 6.0–8.5)

## 2020-08-20 LAB — TSH: TSH: 1.86 u[IU]/mL (ref 0.450–4.500)

## 2020-08-20 NOTE — Telephone Encounter (Signed)
Pt. Given results and instructions. Verbalizes understanding. 

## 2021-03-31 ENCOUNTER — Other Ambulatory Visit: Payer: Self-pay

## 2021-03-31 ENCOUNTER — Ambulatory Visit: Payer: BC Managed Care – PPO | Admitting: Urology

## 2021-03-31 ENCOUNTER — Encounter: Payer: Self-pay | Admitting: Urology

## 2021-03-31 ENCOUNTER — Ambulatory Visit
Admission: RE | Admit: 2021-03-31 | Discharge: 2021-03-31 | Disposition: A | Discharge status: Home / Self Care | Payer: BC Managed Care – PPO | Source: Ambulatory Visit | Attending: Urology | Admitting: Urology

## 2021-03-31 ENCOUNTER — Ambulatory Visit
Admission: RE | Admit: 2021-03-31 | Discharge: 2021-03-31 | Disposition: A | Discharge status: Home / Self Care | Payer: BC Managed Care – PPO | Attending: Urology | Admitting: Urology

## 2021-03-31 VITALS — BP 150/80 | HR 62 | Ht 68.0 in | Wt 204.0 lb

## 2021-03-31 DIAGNOSIS — N200 Calculus of kidney: Secondary | ICD-10-CM | POA: Diagnosis not present

## 2021-03-31 DIAGNOSIS — N529 Male erectile dysfunction, unspecified: Secondary | ICD-10-CM

## 2021-03-31 MED ORDER — SILDENAFIL CITRATE 100 MG PO TABS: 100.0000 mg | ORAL_TABLET | Freq: Every day | ORAL | 6 refills | Status: DC | PRN

## 2021-03-31 NOTE — Progress Notes (Signed)
03/31/2021 8:31 AM   James Henry 1971/04/22 [id number]  Referring provider: Tamsen Roers, PA-C [address] [place],  [place] [address]  Chief Complaint  Patient presents with   Nephrolithiasis    Urologic history: 1.  Bilateral nephrolithiasis CT June 2021 with bilateral renal calculi, largest measuring 4 mm  2.  Erectile dysfunction Sildenafil 100 mg   HPI: 50 y.o. male presents for annual follow-up.  Remains asymptomatic Denies flank, abdominal or pelvic pain No bothersome LUTS Sildenafil effective for ED   PMH: Past Medical History:  Diagnosis Date   Anxiety    GERD (gastroesophageal reflux disease)    Hyperlipidemia    Hypertension     Surgical History: History reviewed. No pertinent surgical history.  Home Medications:  Allergies as of 03/31/2021       Reactions   Codeine    Gadolinium Derivatives Hives   The patient experienced an allergic reaction to the Magnevist (hives). The patient was taken to the Emergency Room for observation on 06/12/2000 during MRI Brain [facility]        Medication List        Accurate as of March 31, 2021  8:31 AM. If you have any questions, ask your nurse or doctor.          gabapentin 300 MG capsule Commonly known as: NEURONTIN Take 1 capsule by mouth twice daily   lansoprazole 30 MG capsule Commonly known as: PREVACID Take 30 mg by mouth 2 (two) times daily.   metoprolol succinate 100 MG 24 hr tablet Commonly known as: TOPROL-XL TAKE 1 TABLET BY MOUTH ONCE DAILY (TAKE  WITH  OR  IMMEDIATELY  FOLLOWING  A  MEAL)   omega-3 acid ethyl esters 1 g capsule Commonly known as: LOVAZA Take 1 g by mouth 2 (two) times daily.   sildenafil 100 MG tablet Commonly known as: VIAGRA Take 1 tablet (100 mg total) by mouth daily as needed for erectile dysfunction.   simvastatin 40 MG tablet Commonly known as: ZOCOR Take 1 tablet (40 mg total) by mouth at bedtime.   sucralfate 1 g tablet Commonly known  as: CARAFATE Take 1 g by mouth 4 (four) times daily.        Allergies:  Allergies  Allergen Reactions   Codeine    Gadolinium Derivatives Hives    The patient experienced an allergic reaction to the Magnevist (hives). The patient was taken to the Emergency Room for observation on 06/12/2000 during MRI Brain [facility]    Family History: Family History  Problem Relation Age of Onset   Coronary artery disease Father    Cancer Father    Leukemia Father    Depression Brother    Heart attack Maternal Grandmother     Social History:  reports that he has quit smoking. His smokeless tobacco use includes snuff. He reports current alcohol use. He reports that he does not use drugs.   Physical Exam: BP (!) 150/80   Pulse 62   Ht 5\' 8"  (1.727 m)   Wt 204 lb (92.5 kg)   BMI 31.02 kg/m   Constitutional:  Alert and oriented, No acute distress. HEENT: [place] AT, moist mucus membranes.  Trachea midline, no masses. Cardiovascular: No clubbing, cyanosis, or edema. Respiratory: Normal respiratory effort, no increased work of breathing. Skin: No rashes, bruises or suspicious lesions. Neurologic: Grossly intact, no focal deficits, moving all 4 extremities. Psychiatric: Normal mood and affect.   Pertinent Imaging: KUB performed today was personally reviewed and  interpreted.  There are calcifications overlying the left renal outline the largest overlying the lower pole measuring 4 mm.  The right renal outline is obscured by overlying stool and bowel gas however there are faint calcifications present measuring 1-2 mm  Assessment & Plan:    1.  Bilateral nephrolithiasis Stable bilateral renal calculi Asymptomatic Follow-up office visit/KUB 1 year and if stable will monitor every other year  2.  Erectile dysfunction Stable on sildenafil Refill sent to pharmacy   Riki Altes, MD  [facility] [address], [address] [place], [place] [address] [phone]

## 2021-04-14 ENCOUNTER — Other Ambulatory Visit: Payer: Self-pay | Admitting: Family Medicine

## 2021-05-02 ENCOUNTER — Ambulatory Visit (INDEPENDENT_AMBULATORY_CARE_PROVIDER_SITE_OTHER): Payer: BC Managed Care – PPO | Admitting: Family Medicine

## 2021-05-02 ENCOUNTER — Other Ambulatory Visit: Payer: Self-pay

## 2021-05-02 ENCOUNTER — Encounter: Payer: Self-pay | Admitting: Family Medicine

## 2021-05-02 VITALS — BP 130/71 | HR 75 | Temp 97.9°F | Ht 68.0 in | Wt 216.4 lb

## 2021-05-02 DIAGNOSIS — I10 Essential (primary) hypertension: Secondary | ICD-10-CM

## 2021-05-02 DIAGNOSIS — R053 Chronic cough: Secondary | ICD-10-CM

## 2021-05-02 DIAGNOSIS — M542 Cervicalgia: Secondary | ICD-10-CM

## 2021-05-02 DIAGNOSIS — G8929 Other chronic pain: Secondary | ICD-10-CM

## 2021-05-02 DIAGNOSIS — Z0000 Encounter for general adult medical examination without abnormal findings: Secondary | ICD-10-CM | POA: Diagnosis not present

## 2021-05-02 DIAGNOSIS — E782 Mixed hyperlipidemia: Secondary | ICD-10-CM

## 2021-05-02 DIAGNOSIS — Z720 Tobacco use: Secondary | ICD-10-CM

## 2021-05-02 MED ORDER — GABAPENTIN 300 MG PO CAPS: 300.0000 mg | ORAL_CAPSULE | Freq: Two times a day (BID) | ORAL | 2 refills | Status: AC

## 2021-05-02 MED ORDER — METOPROLOL SUCCINATE ER 100 MG PO TB24: ORAL_TABLET | ORAL | 3 refills | Status: DC

## 2021-05-02 NOTE — Progress Notes (Signed)
Complete physical exam   Patient: James Henry   DOB: 24-Jul-1971   50 y.o. Male  MRN: [id number] Visit Date: 05/02/2021  Today's healthcare provider: Dortha Kern, PA-C   No chief complaint on file.  Subjective    James Henry is a 50 y.o. male who presents today for a complete physical exam.  He reports consuming a general diet. Home exercise routine includes walking 4 hrs per week. He generally feels well. He reports sleeping well. He does have additional problems to discuss today. A cough for 3 months he has some concerns about.    Past Medical History:  Diagnosis Date   Anxiety    GERD (gastroesophageal reflux disease)    Hyperlipidemia    Hypertension    No past surgical history on file. Social History   Socioeconomic History   Marital status: Married    Spouse name: Not on file   Number of children: Not on file   Years of education: Not on file   Highest education level: Not on file  Occupational History   Not on file  Tobacco Use   Smoking status: Former   Smokeless tobacco: Current    Types: Snuff  Vaping Use   Vaping Use: Never used  Substance and Sexual Activity   Alcohol use: Yes    Alcohol/week: 0.0 standard drinks    Comment: MODERATE USE- 21 BEERS EACH WEEKEND   Drug use: No   Sexual activity: Not on file  Other Topics Concern   Not on file  Social History Narrative   Not on file   Social Determinants of Health   Financial Resource Strain: Not on file  Food Insecurity: Not on file  Transportation Needs: Not on file  Physical Activity: Not on file  Stress: Not on file  Social Connections: Not on file  Intimate Partner Violence: Not on file   Family Status  Relation Name Status   Mother  Alive   Father  Deceased   Sister  Alive   Brother  Alive   MGM  Deceased   Family History  Problem Relation Age of Onset   Coronary artery disease Father    Cancer Father    Leukemia Father    Depression Brother    Heart  attack Maternal Grandmother    Allergies  Allergen Reactions   Codeine    Gadolinium Derivatives Hives    The patient experienced an allergic reaction to the Magnevist (hives). The patient was taken to the Emergency Room for observation on 06/12/2000 during MRI Brain [facility]    Patient Care Team: Cyndra Feinberg, Maryjean Morn as PCP - General (Physician Assistant)   Medications: Outpatient Medications Prior to Visit  Medication Sig   gabapentin (NEURONTIN) 300 MG capsule Take 1 capsule by mouth twice daily   lansoprazole (PREVACID) 30 MG capsule Take 30 mg by mouth 2 (two) times daily.    metoprolol succinate (TOPROL-XL) 100 MG 24 hr tablet TAKE 1 TABLET BY MOUTH ONCE DAILY (TAKE  WITH  OR  IMMEDIATELY  FOLLOWING  A  MEAL)   omega-3 acid ethyl esters (LOVAZA) 1 G capsule Take 1 g by mouth 2 (two) times daily.   sildenafil (VIAGRA) 100 MG tablet Take 1 tablet (100 mg total) by mouth daily as needed for erectile dysfunction.   simvastatin (ZOCOR) 40 MG tablet Take 1 tablet (40 mg total) by mouth at bedtime.   sucralfate (CARAFATE) 1 G tablet Take 1 g by mouth 4 (  four) times daily.    No facility-administered medications prior to visit.    Review of Systems  Respiratory:  Positive for cough.   All other systems reviewed and are negative.     Objective    BP 130/71   Pulse 75   Temp 97.9 F (36.6 C) (Oral)   Ht 5\' 8"  (1.727 m)   Wt 216 lb 6.4 oz (98.2 kg)   SpO2 98%   BMI 32.90 kg/m  BP Readings from Last 3 Encounters:  03/31/21 (!) 150/80  08/19/20 129/83  04/09/20 126/74   Wt Readings from Last 3 Encounters:  03/31/21 204 lb (92.5 kg)  08/19/20 238 lb (108 kg)  04/09/20 240 lb (108.9 kg)      Physical Exam Constitutional:      Appearance: He is well-developed.  HENT:     Head: Normocephalic and atraumatic.     Right Ear: External ear normal.     Left Ear: External ear normal.     Nose: Nose normal.  Eyes:     General:        Right eye: No discharge.      Conjunctiva/sclera: Conjunctivae normal.     Pupils: Pupils are equal, round, and reactive to light.  Neck:     Thyroid: No thyromegaly.     Trachea: No tracheal deviation.  Cardiovascular:     Rate and Rhythm: Normal rate and regular rhythm.     Heart sounds: Normal heart sounds. No murmur heard. Pulmonary:     Effort: Pulmonary effort is normal. No respiratory distress.     Breath sounds: Normal breath sounds. No wheezing or rales.  Chest:     Chest wall: No tenderness.  Abdominal:     General: There is no distension.     Palpations: Abdomen is soft. There is no mass.     Tenderness: There is no abdominal tenderness. There is no guarding or rebound.  Musculoskeletal:        General: No tenderness. Normal range of motion.     Cervical back: Normal range of motion and neck supple.  Lymphadenopathy:     Cervical: No cervical adenopathy.  Skin:    General: Skin is warm and dry.     Findings: No erythema or rash.  Neurological:     Mental Status: He is alert and oriented to person, place, and time.     Cranial Nerves: No cranial nerve deficit.     Motor: No abnormal muscle tone.     Coordination: Coordination normal.     Deep Tendon Reflexes: Reflexes are normal and symmetric. Reflexes normal.  Psychiatric:        Behavior: Behavior normal.        Thought Content: Thought content normal.        Judgment: Judgment normal.       Last depression screening scores PHQ 2/9 Scores 04/09/2020  PHQ - 2 Score 0  PHQ- 9 Score 0   Last fall risk screening Fall Risk  04/09/2020  Falls in the past year? 0  Number falls in past yr: 0  Injury with Fall? 0  Follow up Falls evaluation completed   Last Audit-C alcohol use screening Alcohol Use Disorder Test (AUDIT) 04/09/2020  1. How often do you have a drink containing alcohol? 2  2. How many drinks containing alcohol do you have on a typical day when you are drinking? 2  3. How often do you have six or more drinks on one  occasion? 1   AUDIT-C Score 5   A score of 3 or more in women, and 4 or more in men indicates increased risk for alcohol abuse, EXCEPT if all of the points are from question 1   No results found for any visits on 05/02/21.  Assessment & Plan    Routine Health Maintenance and Physical Exam  Exercise Activities and Dietary recommendations  Goals   None     Immunization History  Administered Date(s) Administered   Hepatitis B, adult 06/12/2014   Tdap 06/06/2013    Health Maintenance  Topic Date Due   COVID-19 Vaccine (1) Never done   HIV Screening  Never done   Hepatitis C Screening  Never done   COLONOSCOPY (Pts 45-51yrs Insurance coverage will need to be confirmed)  Never done   Zoster Vaccines- Shingrix (1 of 2) Never done   INFLUENZA VACCINE  05/09/2021   TETANUS/TDAP  06/07/2023   Pneumococcal Vaccine 11-61 Years old  Aged Out   HPV VACCINES  Aged Out    Discussed health benefits of physical activity, and encouraged him to engage in regular exercise appropriate for his age and condition.  1. Annual physical exam Good general health. Last colonoscopy was August 2021 without malignancy. Some polyps and plans repeat exam in 5 years. Will get routine labs. - CBC with Differential/Platelet - Comprehensive metabolic panel - Lipid panel - TSH  2. Essential (primary) hypertension Well controlled with use of Toprol-XL 100 mg qd. Recheck labs. - CBC with Differential/Platelet - Comprehensive metabolic panel - Lipid panel - TSH - metoprolol succinate (TOPROL-XL) 100 MG 24 hr tablet; Take with or immediately following a meal.  Dispense: 90 tablet; Refill: 3  3. Mixed hyperlipidemia Still taking the Simvastatin 40 mg qd without side effects. Recheck labs. - CBC with Differential/Platelet - Comprehensive metabolic panel - Lipid panel - TSH  4. Chronic neck pain Intermittent flare with reaching over head at work. Neurontin prn helps control discomfort. - gabapentin (NEURONTIN) 300  MG capsule; Take 1 capsule (300 mg total) by mouth 2 (two) times daily.  Dispense: 60 capsule; Refill: 2  5. Chronic cough Nocturnal cough the past 3 months. CXR showed some chronic changes last Nov. 2021. Check LDCT of lungs. - CT CHEST LUNG CA SCREEN LOW DOSE W/O CM - CBC with Differential/Platelet  6. Tobacco use History of >20 pk-year smoking. Recommend low dose CT screening. - CT CHEST LUNG CA SCREEN LOW DOSE W/O CM   No follow-ups on file.     I, Wrenly Lauritsen, PA-C, have reviewed all documentation for this visit. The documentation on 05/02/21 for the exam, diagnosis, procedures, and orders are all accurate and complete.    Dortha Kern, PA-C  [facility] [phone] (phone) [phone] (fax)  [facility]

## 2021-05-03 LAB — COMPREHENSIVE METABOLIC PANEL
ALT: 38 IU/L (ref 0–44)
AST: 30 IU/L (ref 0–40)
Albumin/Globulin Ratio: 2.2 (ref 1.2–2.2)
Albumin: 4.9 g/dL (ref 3.8–4.9)
Alkaline Phosphatase: 85 IU/L (ref 44–121)
BUN/Creatinine Ratio: 18 (ref 9–20)
BUN: 14 mg/dL (ref 6–24)
Bilirubin Total: 0.6 mg/dL (ref 0.0–1.2)
CO2: 19 mmol/L — ABNORMAL LOW (ref 20–29)
Calcium: 9.6 mg/dL (ref 8.7–10.2)
Chloride: 104 mmol/L (ref 96–106)
Creatinine, Ser: 0.77 mg/dL (ref 0.76–1.27)
Globulin, Total: 2.2 g/dL (ref 1.5–4.5)
Glucose: 87 mg/dL (ref 65–99)
Potassium: 4.3 mmol/L (ref 3.5–5.2)
Sodium: 141 mmol/L (ref 134–144)
Total Protein: 7.1 g/dL (ref 6.0–8.5)
eGFR: 108 mL/min/{1.73_m2} (ref >59)

## 2021-05-03 LAB — CBC WITH DIFFERENTIAL/PLATELET
Basophils Absolute: 0.1 10*3/uL (ref 0.0–0.2)
Basos: 1 %
EOS (ABSOLUTE): 0.3 10*3/uL (ref 0.0–0.4)
Eos: 3 %
Hematocrit: 52.2 % — ABNORMAL HIGH (ref 37.5–51.0)
Hemoglobin: 17.2 g/dL (ref 13.0–17.7)
Immature Grans (Abs): 0 10*3/uL (ref 0.0–0.1)
Immature Granulocytes: 0 %
Lymphocytes Absolute: 3.2 10*3/uL — ABNORMAL HIGH (ref 0.7–3.1)
Lymphs: 33 %
MCH: 28.8 pg (ref 26.6–33.0)
MCHC: 33 g/dL (ref 31.5–35.7)
MCV: 87 fL (ref 79–97)
Monocytes Absolute: 0.7 10*3/uL (ref 0.1–0.9)
Monocytes: 8 %
Neutrophils Absolute: 5.3 10*3/uL (ref 1.4–7.0)
Neutrophils: 55 %
Platelets: 251 10*3/uL (ref 150–450)
RBC: 5.98 x10E6/uL — ABNORMAL HIGH (ref 4.14–5.80)
RDW: 14.3 % (ref 11.6–15.4)
WBC: 9.6 10*3/uL (ref 3.4–10.8)

## 2021-05-03 LAB — LIPID PANEL
Chol/HDL Ratio: 3.8 ratio (ref 0.0–5.0)
Cholesterol, Total: 141 mg/dL (ref 100–199)
HDL: 37 mg/dL — ABNORMAL LOW (ref >39)
LDL Chol Calc (NIH): 68 mg/dL (ref 0–99)
Triglycerides: 219 mg/dL — ABNORMAL HIGH (ref 0–149)
VLDL Cholesterol Cal: 36 mg/dL (ref 5–40)

## 2021-05-03 LAB — TSH: TSH: 1.79 u[IU]/mL (ref 0.450–4.500)

## 2021-05-09 ENCOUNTER — Telehealth: Payer: Self-pay

## 2021-05-09 NOTE — Telephone Encounter (Signed)
See order in chart from 05-02-21.

## 2021-05-09 NOTE — Telephone Encounter (Signed)
Copied from CRM [id number]. Topic: Referral - Status >> May 09, 2021  3:09 PM Leafy Ro wrote: Reason for CRM: Pt is calling checking on the status of low dose ct lung screening test

## 2021-05-23 ENCOUNTER — Other Ambulatory Visit: Payer: Self-pay | Admitting: *Deleted

## 2021-05-23 DIAGNOSIS — F17210 Nicotine dependence, cigarettes, uncomplicated: Secondary | ICD-10-CM

## 2021-06-01 ENCOUNTER — Encounter: Payer: Self-pay | Admitting: Acute Care

## 2021-06-01 ENCOUNTER — Ambulatory Visit (INDEPENDENT_AMBULATORY_CARE_PROVIDER_SITE_OTHER): Payer: BC Managed Care – PPO | Admitting: Acute Care

## 2021-06-01 ENCOUNTER — Other Ambulatory Visit: Payer: Self-pay

## 2021-06-01 DIAGNOSIS — F17210 Nicotine dependence, cigarettes, uncomplicated: Secondary | ICD-10-CM | POA: Diagnosis not present

## 2021-06-01 NOTE — Progress Notes (Signed)
Virtual Visit via Telephone Note  I connected with James Henry on 06/01/21 at  9:00 AM EDT by telephone and verified that I am speaking with the correct person using two identifiers.  Location: Patient: at home Provider: [address], [place], [place], [address]    I discussed the limitations, risks, security and privacy concerns of performing an evaluation and management service by telephone and the availability of in person appointments. I also discussed with the patient that there may be a patient responsible charge related to this service. The patient expressed understanding and agreed to proceed.    Shared Decision Making Visit Lung Cancer Screening Program [phone])   Eligibility: Age 50 y.o. Pack Years Smoking History Calculation 30 pack tears (# packs/per year x # years smoked) Recent History of coughing up blood  no Unexplained weight loss? no ( >Than 15 pounds within the last 6 months ) Prior History Lung / other cancer no (Diagnosis within the last 5 years already requiring surveillance chest CT Scans). Smoking Status Current Smoker Former Smokers: Years since quit: NA  Quit Date: NA  Visit Components: Discussion included one or more decision making aids. yes Discussion included risk/benefits of screening. yes Discussion included potential follow up diagnostic testing for abnormal scans. yes Discussion included meaning and risk of over diagnosis. yes Discussion included meaning and risk of False Positives. yes Discussion included meaning of total radiation exposure. yes  Counseling Included: Importance of adherence to annual lung cancer LDCT screening. yes Impact of comorbidities on ability to participate in the program. yes Ability and willingness to under diagnostic treatment. yes  Smoking Cessation Counseling: Current Smokers:  Discussed importance of smoking cessation. yes Information about tobacco cessation classes and interventions provided  to patient. yes Patient provided with "ticket" for LDCT Scan. yes Symptomatic Patient. no  Counseling Diagnosis Code: Tobacco Use Z72.0 Asymptomatic Patient yes  Counseling (Intermediate counseling: > three minutes counseling) [id number] Former Smokers:  Discussed the importance of maintaining cigarette abstinence. yes Diagnosis Code: Personal History of Nicotine Dependence. O27.035 Information about tobacco cessation classes and interventions provided to patient. Yes Patient provided with "ticket" for LDCT Scan. yes Written Order for Lung Cancer Screening with LDCT placed in Epic. Yes (CT Chest Lung Cancer Screening Low Dose W/O CM) [id number] Z12.2-Screening of respiratory organs Z87.891-Personal history of nicotine dependence  I have spent 25 minutes of face to face time with James Henry discussing the risks and benefits of lung cancer screening. We viewed a power point together that explained in detail the above noted topics. We paused at intervals to allow for questions to be asked and answered to ensure understanding.We discussed that the single most powerful action that he can take to decrease his risk of developing lung cancer is to quit smoking. We discussed whether or not he is ready to commit to setting a quit date. We discussed options for tools to aid in quitting smoking including nicotine replacement therapy, non-nicotine medications, support groups, Quit Smart classes, and behavior modification. We discussed that often times setting smaller, more achievable goals, such as eliminating 1 cigarette a day for a week and then 2 cigarettes a day for a week can be helpful in slowly decreasing the number of cigarettes smoked. This allows for a sense of accomplishment as well as providing a clinical benefit. I gave him the " Be Stronger Than Your Excuses" card with contact information for community resources, classes, free nicotine replacement therapy, and access to mobile apps, text messaging, and  on-line smoking cessation help. I have also given him my card and contact information in the event he needs to contact me. We discussed the time and location of the scan, and that either Abigail Miyamoto RN or I will call with the results within 24-48 hours of receiving them. I have offered him  a copy of the power point we viewed  as a resource in the event they need reinforcement of the concepts we discussed today in the office. The patient verbalized understanding of all of  the above and had no further questions upon leaving the office. They have my contact information in the event they have any further questions.  I spent 4 minutes counseling on smoking cessation and the health risks of continued tobacco abuse.  I explained to the patient that there has been a high incidence of coronary artery disease noted on these exams. I explained that this is a non-gated exam therefore degree or severity cannot be determined. This patient is on statin therapy. I have asked the patient to follow-up with their PCP regarding any incidental finding of coronary artery disease and management with diet or medication as their PCP  feels is clinically indicated. The patient verbalized understanding of the above and had no further questions upon completion of the visit.    Pt. Is going to contact PCP about starting Chantix  Bevelyn Ngo, NP 06/01/2021

## 2021-06-01 NOTE — Patient Instructions (Signed)
Thank you for participating in the [facility]. It was our pleasure to meet you today. We will call you with the results of your scan within the next few days. Your scan will be assigned a Lung RADS category score by the physicians reading the scans.  This Lung RADS score determines follow up scanning.  See below for description of categories, and follow up screening recommendations. We will be in touch to schedule your follow up screening annually or based on recommendations of our providers. We will fax a copy of your scan results to your Primary Care Physician, or the physician who referred you to the program, to ensure they have the results. Please call the office if you have any questions or concerns regarding your scanning experience or results.  Our office number is [phone]. Please speak with Denise Phelps, RN. She is our Lung Cancer Screening RN. If she is unavailable when you call, please have the office staff send her a message. She will return your call at her earliest convenience. Remember, if your scan is normal, we will scan you annually as long as you continue to meet the criteria for the program. (Age 55-77, Current smoker or smoker who has quit within the last 15 years). If you are a smoker, remember, quitting is the single most powerful action that you can take to decrease your risk of lung cancer and other pulmonary, breathing related problems. We know quitting is hard, and we are here to help.  Please let us know if there is anything we can do to help you meet your goal of quitting. If you are a former smoker, congratulations. We are proud of you! Remain smoke free! Remember you can refer friends or family members through the number above.  We will screen them to make sure they meet criteria for the program. Thank you for helping us take better care of you by participating in Lung Screening.  Lung RADS Categories:  Lung RADS 1: no nodules  or definitely non-concerning nodules.  Recommendation is for a repeat annual scan in 12 months.  Lung RADS 2:  nodules that are non-concerning in appearance and behavior with a very low likelihood of becoming an active cancer. Recommendation is for a repeat annual scan in 12 months.  Lung RADS 3: nodules that are probably non-concerning , includes nodules with a low likelihood of becoming an active cancer.  Recommendation is for a 6-month repeat screening scan. Often noted after an upper respiratory illness. We will be in touch to make sure you have no questions, and to schedule your 6-month scan.  Lung RADS 4 A: nodules with concerning findings, recommendation is most often for a follow up scan in 3 months or additional testing based on our provider's assessment of the scan. We will be in touch to make sure you have no questions and to schedule the recommended 3 month follow up scan.  Lung RADS 4 B:  indicates findings that are concerning. We will be in touch with you to schedule additional diagnostic testing based on our provider's  assessment of the scan.   

## 2021-06-02 ENCOUNTER — Ambulatory Visit
Admission: RE | Admit: 2021-06-02 | Discharge: 2021-06-02 | Disposition: A | Discharge status: Home / Self Care | Payer: BC Managed Care – PPO | Source: Ambulatory Visit | Attending: Acute Care | Admitting: Acute Care

## 2021-06-02 DIAGNOSIS — F17210 Nicotine dependence, cigarettes, uncomplicated: Secondary | ICD-10-CM | POA: Insufficient documentation

## 2021-06-03 ENCOUNTER — Telehealth: Payer: Self-pay | Admitting: Acute Care

## 2021-06-03 DIAGNOSIS — F17210 Nicotine dependence, cigarettes, uncomplicated: Secondary | ICD-10-CM

## 2021-06-03 NOTE — Telephone Encounter (Signed)
Pt informed of CT results per Kandice Robinsons, NP.  PT verbalized understanding.  Copy of CT sent to PCP.  Order placed for 6 mth f/u CT.  Forwarding to Kandice Robinsons, NP as an Lorain Childes.

## 2021-06-03 NOTE — Telephone Encounter (Signed)
SG please advise on CT results. Thanks

## 2021-06-15 NOTE — Progress Notes (Signed)
Please call patient and let them  know their  low dose Ct was read as a Lung  RADS 3, nodules that are probably benign findings, short term follow up suggested: includes nodules with a low likelihood of becoming a clinically active cancer. Radiology recommends a 6 month repeat LDCT follow up. .Please let them  know we will order and schedule their  annual screening scan for 11/2021. Please let them  know there was notation of CAD on their  scan.  Please remind the patient  that this is a non-gated exam therefore degree or severity of disease  cannot be determined. Please have them  follow up with their PCP regarding potential risk factor modification, dietary therapy or pharmacologic therapy if clinically indicated. Pt.  is  currently on statin therapy. Please place order for annual  screening scan for  05/2022 and fax results to PCP. Thanks so much.  See telephone message dates 06/03/2021. Thanks so much

## 2021-07-18 ENCOUNTER — Ambulatory Visit: Payer: BC Managed Care – PPO | Admitting: Family Medicine

## 2021-07-19 ENCOUNTER — Other Ambulatory Visit: Payer: Self-pay

## 2021-07-19 ENCOUNTER — Ambulatory Visit: Payer: BC Managed Care – PPO | Admitting: Surgery

## 2021-07-19 ENCOUNTER — Encounter: Payer: Self-pay | Admitting: Surgery

## 2021-07-19 VITALS — Temp 98.3°F | Ht 68.0 in | Wt 218.6 lb

## 2021-07-19 DIAGNOSIS — M25552 Pain in left hip: Secondary | ICD-10-CM | POA: Diagnosis not present

## 2021-07-19 DIAGNOSIS — R1032 Left lower quadrant pain: Secondary | ICD-10-CM | POA: Insufficient documentation

## 2021-07-19 DIAGNOSIS — K4020 Bilateral inguinal hernia, without obstruction or gangrene, not specified as recurrent: Secondary | ICD-10-CM | POA: Insufficient documentation

## 2021-07-19 MED ORDER — MELOXICAM 15 MG PO TABS: 15.0000 mg | ORAL_TABLET | Freq: Every day | ORAL | 0 refills | Status: DC

## 2021-07-19 NOTE — Patient Instructions (Addendum)
We can get you scheduled for an ultrasound of the left groin.   You are scheduled for an Ultrasound at [facility] on 07/27/21 at 10:15 am. You will need to arrive there by 10:00 am.  We will call you with the results of this Ultrasound.  You may use a heating pad to the area several times a day if this makes it feel better  We will prescribe you some medication for your pain to try.

## 2021-07-19 NOTE — Progress Notes (Signed)
Patient ID: James Henry, male   DOB: [birth year], 50 y.o.   MRN: [id number]  Chief Complaint: Left hip pain  History of Present Illness James Henry is a 50 y.o. male with a relatively constant yet progressive left hip pain over the last year.  Was concerned that there may be a bump in the groin over the last 2 months that may be exacerbating the degree of aching.  The pain is quite constant, and nagging.  He has been utilizing ibuprofen and recently had a trial of steroids without significant improvement or control of the pain.  He is willing to trial Mobic to help improve the pain.  Pain however it is exacerbated by bending and twisting at the hip.  He denies any pain with bearing down, coughing or sneezing.  He has no known bulge that comes or goes.  He reports the pain is fully alleviated in bed at rest laying flat.  He denies any fevers chills, night sweats or weight loss. On review we had a CT scan over a year ago for hematuria and kidney stones.  Reviewed these images together.  He reports he had a recent x-ray of the hip which showed a degree of arthritic changes in that hip. He already takes some Neurontin for some neck pain.   Past Medical History Past Medical History:  Diagnosis Date   Anxiety    GERD (gastroesophageal reflux disease)    Hyperlipidemia    Hypertension       Past Surgical History:  Procedure Laterality Date   COLONOSCOPY  05/2020    Allergies  Allergen Reactions   Codeine Itching   Gadolinium Derivatives Hives    The patient experienced an allergic reaction to the Magnevist (hives). The patient was taken to the Emergency Room for observation on 06/12/2000 during MRI Brain [facility]    Current Outpatient Medications  Medication Sig Dispense Refill   gabapentin (NEURONTIN) 300 MG capsule Take 1 capsule (300 mg total) by mouth 2 (two) times daily. 60 capsule 2   lansoprazole (PREVACID) 30 MG capsule Take 30 mg by mouth 2 (two) times daily.       meloxicam (MOBIC) 15 MG tablet Take 1 tablet (15 mg total) by mouth daily. 30 tablet 0   metoprolol succinate (TOPROL-XL) 100 MG 24 hr tablet Take with or immediately following a meal. 90 tablet 3   omega-3 acid ethyl esters (LOVAZA) 1 G capsule Take 1 g by mouth 2 (two) times daily.     sildenafil (VIAGRA) 100 MG tablet Take 1 tablet (100 mg total) by mouth daily as needed for erectile dysfunction. 30 tablet 6   simvastatin (ZOCOR) 40 MG tablet Take 1 tablet (40 mg total) by mouth at bedtime. 90 tablet 3   sucralfate (CARAFATE) 1 G tablet Take 1 g by mouth 4 (four) times daily.      No current facility-administered medications for this visit.    Family History Family History  Problem Relation Age of Onset   Coronary artery disease Father    Cancer Father    Leukemia Father    Depression Brother    Heart attack Maternal Grandmother       Social History Social History   Tobacco Use   Smoking status: Every Day    Packs/day: 3.00    Types: Cigarettes   Smokeless tobacco: Current   Tobacco comments:    1 pack daily-06/01/21  Vaping Use   Vaping Use: Never used  Substance Use Topics  Alcohol use: Yes    Alcohol/week: 0.0 standard drinks    Comment: MODERATE USE- 21 BEERS EACH WEEKEND   Drug use: No        Review of Systems  Constitutional: Negative.   HENT: Negative.    Eyes: Negative.   Respiratory: Negative.    Cardiovascular: Negative.   Gastrointestinal: Negative.   Genitourinary: Negative.   Skin: Negative.   Neurological: Negative.   Psychiatric/Behavioral: Negative.       Physical Exam Temperature 98.3 F (36.8 C), height 5\' 8"  (1.727 m), weight 218 lb 9.6 oz (99.2 kg). Last Weight  Most recent update: 07/19/2021  8:56 AM    Weight  99.2 kg (218 lb 9.6 oz)             CONSTITUTIONAL: Well developed, and nourished, appropriately responsive and aware without distress.  Overweight male. EYES: Sclera non-icteric.   EARS, NOSE, MOUTH AND THROAT: Mask  worn.   Hearing is intact to voice.  NECK: Trachea is midline, and there is no jugular venous distension.  LYMPH NODES:  Lymph nodes in the neck are not enlarged. RESPIRATORY:  Lungs are clear, and breath sounds are equal bilaterally. Normal respiratory effort without pathologic use of accessory muscles. CARDIOVASCULAR: Heart is regular in rate and rhythm. GI: The abdomen is well rounded, otherwise soft, nontender, and nondistended. There were no palpable masses. I did not appreciate hepatosplenomegaly.  GU: Both testes are descended and adnexa are not remarkable.  There is a vague soft tissue density on the left, but I cannot define well.  I do not appreciate any obvious lymphadenopathy, in the left groin.  I can palpate the protrusion of a hernia sac on both sides.  During the exam from the external ring this is not where he has pain, he appreciates pain more high in the groin area.   MUSCULOSKELETAL:  Symmetrical muscle tone appreciated in all four extremities.    SKIN: Skin turgor is normal. No pathologic skin lesions appreciated.  NEUROLOGIC:  Motor and sensation appear grossly normal.  Cranial nerves are grossly without defect. PSYCH:  Alert and oriented to person, place and time. Affect is appropriate for situation.  Data Reviewed I have personally reviewed what is currently available of the patient's imaging, recent labs and medical records.   Labs:  CBC Latest Ref Rng & Units 05/02/2021 08/19/2020 04/09/2020  WBC 3.4 - 10.8 x10E3/uL 9.6 9.1 8.3  Hemoglobin 13.0 - 17.7 g/dL 06/10/2020 78.2 95.6  Hematocrit 37.5 - 51.0 % 52.2(H) 46.7 46.5  Platelets 150 - 450 x10E3/uL 251 298 239   CMP Latest Ref Rng & Units 05/02/2021 08/19/2020 04/09/2020  Glucose 65 - 99 mg/dL 87 96 95  BUN 6 - 24 mg/dL 14 15 14   Creatinine 0.76 - 1.27 mg/dL 06/10/2020 0.86  Sodium 134 - 144 mmol/L 141 140 139  Potassium 3.5 - 5.2 mmol/L 4.3 4.0 3.9  Chloride 96 - 106 mmol/L 104 103 103  CO2 20 - 29 mmol/L 19(L) 20 22   Calcium 8.7 - 10.2 mg/dL 9.6 9.9 9.6  Total Protein 6.0 - 8.5 g/dL 7.1 7.6 7.3  Total Bilirubin 0.0 - 1.2 mg/dL 0.6 0.6 0.5  Alkaline Phos 44 - 121 IU/L 85 93 85  AST 0 - 40 IU/L 30 34 40  ALT 0 - 44 IU/L 38 56(H) 55(H)      Imaging: Radiology review:  CLINICAL DATA:  Right flank pain for 9 months. Microscopic hematuria. Nephrolithiasis.   EXAM: CT ABDOMEN  AND PELVIS WITH CONTRAST   TECHNIQUE: Multidetector CT imaging of the abdomen and pelvis was performed using the standard protocol following bolus administration of intravenous contrast.   CONTRAST:  OMNIPAQUE IOHEXOL 300 MG/ML  SOLN   COMPARISON:  04/27/2017   FINDINGS: Lower Chest: No acute findings.   Hepatobiliary: No hepatic masses identified. Mild-to-moderate diffuse hepatic steatosis. Gallbladder is unremarkable. No evidence of biliary ductal dilatation.   Pancreas:  No mass or inflammatory changes.   Spleen: Within normal limits in size and appearance.   Adrenals/Urinary Tract: Stable tiny bilateral renal cysts. No masses identified. Small calculi are again seen in both kidneys, largest in lower pole of left kidney measuring 4 mm. No evidence of ureteral calculi or hydronephrosis. Unremarkable unopacified urinary bladder.   Stomach/Bowel: No evidence of obstruction, inflammatory process or abnormal fluid collections. Diverticulosis is seen mainly involving the descending and sigmoid colon, however there is no evidence of diverticulitis.   Vascular/Lymphatic: No pathologically enlarged lymph nodes. No abdominal aortic aneurysm.   Reproductive:  No mass or other significant abnormality.   Other:  None.   Musculoskeletal:  No suspicious bone lesions identified.   IMPRESSION: 1. Bilateral nephrolithiasis. No evidence of ureteral calculi, hydronephrosis, or other acute findings. 2. Colonic diverticulosis, without radiographic evidence of diverticulitis. 3. Hepatic steatosis.      Electronically Signed   By: Danae Orleans M.D.   On: 03/19/2020 10:56  Within last 24 hrs: No results found.  Assessment    Left hip pain, more consistent with osteoarthritis than inguinal hernia. Bilateral inguinal hernias, possibly both asymptomatic.  The left may be contributing to the left-sided pain. Patient Active Problem List   Diagnosis Date Noted   Nephrolithiasis 03/31/2020   Chronic neck pain 06/17/2015   Other dysphagia 06/17/2015   Airway hyperreactivity 04/09/2015   Abnormal liver enzymes 04/09/2015   Acid reflux 04/09/2015   HLD (hyperlipidemia) 04/09/2015   Adaptive colitis 04/09/2015   Adiposity 04/09/2015   Acid indigestion 04/08/2015   Esophageal reflux 04/08/2015   Elevated cholesterol with elevated triglycerides 04/08/2015   Severe anxiety with panic 04/08/2015   Fatty infiltration of liver 04/08/2015   Anxiety, generalized 12/03/2014   Combined fat and carbohydrate induced hyperlipemia 09/10/2008   Essential (primary) hypertension 07/09/2008    Plan    Per patient's request, we will proceed with further evaluation by ultrasound of the left groin region. Unfortunately this test will not be completed before he heads out on vacation on Thursday.  I will attempt to reach out to him by phone once the ultrasound was obtained, at present I concur he is reluctant to pursue hernia repair, as I cannot guarantee this will resolve the left hip pain that is his primary issue at this point.  We will further discuss our findings on the ultrasound and determine if additional testing is necessary, and hopefully orchestrate this over the phone. In the interim we will try Mobic for improved pain control instead of ibuprofen. Face-to-face time spent with the patient and accompanying care providers(if present) was 30 minutes, with more than 50% of the time spent counseling, educating, and coordinating care of the patient.    These notes generated with voice recognition  software. I apologize for typographical errors.  Campbell Lerner M.D., FACS 07/19/2021, 9:20 AM

## 2021-07-27 ENCOUNTER — Ambulatory Visit
Admission: RE | Admit: 2021-07-27 | Discharge: 2021-07-27 | Disposition: A | Discharge status: Home / Self Care | Payer: BC Managed Care – PPO | Source: Ambulatory Visit | Attending: Surgery | Admitting: Surgery

## 2021-07-27 ENCOUNTER — Other Ambulatory Visit: Payer: Self-pay

## 2021-07-27 DIAGNOSIS — R1030 Lower abdominal pain, unspecified: Secondary | ICD-10-CM | POA: Diagnosis not present

## 2021-07-27 DIAGNOSIS — R1032 Left lower quadrant pain: Secondary | ICD-10-CM | POA: Insufficient documentation

## 2021-07-27 DIAGNOSIS — R590 Localized enlarged lymph nodes: Secondary | ICD-10-CM | POA: Diagnosis not present

## 2021-07-29 DIAGNOSIS — M25552 Pain in left hip: Secondary | ICD-10-CM | POA: Diagnosis not present

## 2021-08-09 ENCOUNTER — Other Ambulatory Visit (HOSPITAL_COMMUNITY): Payer: Self-pay | Admitting: Orthopedic Surgery

## 2021-08-09 ENCOUNTER — Other Ambulatory Visit: Payer: Self-pay | Admitting: Orthopedic Surgery

## 2021-08-09 DIAGNOSIS — M25552 Pain in left hip: Secondary | ICD-10-CM

## 2021-08-13 ENCOUNTER — Ambulatory Visit
Admission: RE | Admit: 2021-08-13 | Discharge: 2021-08-13 | Disposition: A | Discharge status: Home / Self Care | Payer: BC Managed Care – PPO | Source: Ambulatory Visit | Attending: Orthopedic Surgery | Admitting: Orthopedic Surgery

## 2021-08-13 ENCOUNTER — Other Ambulatory Visit: Payer: Self-pay

## 2021-08-13 DIAGNOSIS — M25552 Pain in left hip: Secondary | ICD-10-CM | POA: Insufficient documentation

## 2021-08-13 DIAGNOSIS — M1612 Unilateral primary osteoarthritis, left hip: Secondary | ICD-10-CM | POA: Diagnosis not present

## 2021-08-23 DIAGNOSIS — M169 Osteoarthritis of hip, unspecified: Secondary | ICD-10-CM | POA: Diagnosis not present

## 2021-08-23 DIAGNOSIS — M25552 Pain in left hip: Secondary | ICD-10-CM | POA: Diagnosis not present

## 2021-08-30 DIAGNOSIS — M5412 Radiculopathy, cervical region: Secondary | ICD-10-CM | POA: Diagnosis not present

## 2021-08-30 DIAGNOSIS — S56911A Strain of unspecified muscles, fascia and tendons at forearm level, right arm, initial encounter: Secondary | ICD-10-CM | POA: Diagnosis not present

## 2021-09-20 ENCOUNTER — Encounter: Payer: Self-pay | Admitting: Urology

## 2021-10-07 DIAGNOSIS — M5412 Radiculopathy, cervical region: Secondary | ICD-10-CM | POA: Diagnosis not present

## 2021-10-07 DIAGNOSIS — R29898 Other symptoms and signs involving the musculoskeletal system: Secondary | ICD-10-CM | POA: Diagnosis not present

## 2021-10-13 ENCOUNTER — Other Ambulatory Visit: Payer: Self-pay | Admitting: Orthopedic Surgery

## 2021-10-13 DIAGNOSIS — M5412 Radiculopathy, cervical region: Secondary | ICD-10-CM

## 2021-10-22 ENCOUNTER — Ambulatory Visit
Admission: RE | Admit: 2021-10-22 | Discharge: 2021-10-22 | Disposition: A | Discharge status: Home / Self Care | Payer: BC Managed Care – PPO | Source: Ambulatory Visit | Attending: Orthopedic Surgery | Admitting: Orthopedic Surgery

## 2021-10-22 DIAGNOSIS — M47812 Spondylosis without myelopathy or radiculopathy, cervical region: Secondary | ICD-10-CM | POA: Diagnosis not present

## 2021-10-22 DIAGNOSIS — R200 Anesthesia of skin: Secondary | ICD-10-CM | POA: Diagnosis not present

## 2021-10-22 DIAGNOSIS — M5412 Radiculopathy, cervical region: Secondary | ICD-10-CM | POA: Insufficient documentation

## 2021-10-22 DIAGNOSIS — M542 Cervicalgia: Secondary | ICD-10-CM | POA: Diagnosis not present

## 2021-10-22 DIAGNOSIS — R202 Paresthesia of skin: Secondary | ICD-10-CM | POA: Diagnosis not present

## 2021-11-03 DIAGNOSIS — M5412 Radiculopathy, cervical region: Secondary | ICD-10-CM | POA: Diagnosis not present

## 2021-11-07 ENCOUNTER — Telehealth: Payer: Self-pay | Admitting: Family Medicine

## 2021-11-07 ENCOUNTER — Other Ambulatory Visit: Payer: Self-pay

## 2021-11-07 DIAGNOSIS — E782 Mixed hyperlipidemia: Secondary | ICD-10-CM

## 2021-11-07 NOTE — Telephone Encounter (Signed)
[facility] faxed refill request for the following medications:    simvastatin (ZOCOR) 40 MG tablet   Please advise.  

## 2021-11-09 DIAGNOSIS — M5412 Radiculopathy, cervical region: Secondary | ICD-10-CM | POA: Diagnosis not present

## 2021-11-09 MED ORDER — SIMVASTATIN 40 MG PO TABS: 40.0000 mg | ORAL_TABLET | Freq: Every day | ORAL | 0 refills | Status: DC

## 2021-11-09 NOTE — Telephone Encounter (Signed)
Pt called to check status of refill. Pt states that he has been off medication for 6 days now. Please advise. Last seen 04/29/21.

## 2021-11-30 DIAGNOSIS — M5412 Radiculopathy, cervical region: Secondary | ICD-10-CM | POA: Diagnosis not present

## 2021-11-30 DIAGNOSIS — M542 Cervicalgia: Secondary | ICD-10-CM | POA: Diagnosis not present

## 2021-12-06 ENCOUNTER — Ambulatory Visit
Admission: RE | Admit: 2021-12-06 | Discharge: 2021-12-06 | Disposition: A | Discharge status: Home / Self Care | Payer: BC Managed Care – PPO | Source: Ambulatory Visit | Attending: Acute Care | Admitting: Acute Care

## 2021-12-06 ENCOUNTER — Other Ambulatory Visit: Payer: Self-pay

## 2021-12-06 DIAGNOSIS — I700 Atherosclerosis of aorta: Secondary | ICD-10-CM | POA: Diagnosis not present

## 2021-12-06 DIAGNOSIS — J432 Centrilobular emphysema: Secondary | ICD-10-CM | POA: Diagnosis not present

## 2021-12-06 DIAGNOSIS — F17210 Nicotine dependence, cigarettes, uncomplicated: Secondary | ICD-10-CM | POA: Insufficient documentation

## 2021-12-06 DIAGNOSIS — M47814 Spondylosis without myelopathy or radiculopathy, thoracic region: Secondary | ICD-10-CM | POA: Diagnosis not present

## 2021-12-06 DIAGNOSIS — R911 Solitary pulmonary nodule: Secondary | ICD-10-CM | POA: Diagnosis not present

## 2021-12-08 ENCOUNTER — Other Ambulatory Visit: Payer: Self-pay | Admitting: Acute Care

## 2021-12-08 DIAGNOSIS — F17210 Nicotine dependence, cigarettes, uncomplicated: Secondary | ICD-10-CM

## 2021-12-30 DIAGNOSIS — M5412 Radiculopathy, cervical region: Secondary | ICD-10-CM | POA: Diagnosis not present

## 2021-12-30 DIAGNOSIS — M542 Cervicalgia: Secondary | ICD-10-CM | POA: Diagnosis not present

## 2022-01-05 ENCOUNTER — Other Ambulatory Visit: Payer: Self-pay | Admitting: Urology

## 2022-01-05 DIAGNOSIS — N529 Male erectile dysfunction, unspecified: Secondary | ICD-10-CM

## 2022-01-10 ENCOUNTER — Other Ambulatory Visit: Payer: Self-pay | Admitting: *Deleted

## 2022-01-10 ENCOUNTER — Ambulatory Visit: Payer: Self-pay | Admitting: Family Medicine

## 2022-01-10 DIAGNOSIS — E782 Mixed hyperlipidemia: Secondary | ICD-10-CM

## 2022-01-10 MED ORDER — SIMVASTATIN 40 MG PO TABS: 40.0000 mg | ORAL_TABLET | Freq: Every day | ORAL | 0 refills | Status: DC

## 2022-01-24 ENCOUNTER — Ambulatory Visit (INDEPENDENT_AMBULATORY_CARE_PROVIDER_SITE_OTHER): Payer: BC Managed Care – PPO | Admitting: Family Medicine

## 2022-01-24 ENCOUNTER — Encounter: Payer: Self-pay | Admitting: Family Medicine

## 2022-01-24 VITALS — BP 132/76 | HR 86 | Temp 98.7°F | Resp 16 | Wt 244.9 lb

## 2022-01-24 DIAGNOSIS — Z1159 Encounter for screening for other viral diseases: Secondary | ICD-10-CM | POA: Diagnosis not present

## 2022-01-24 DIAGNOSIS — E7800 Pure hypercholesterolemia, unspecified: Secondary | ICD-10-CM

## 2022-01-24 DIAGNOSIS — Z125 Encounter for screening for malignant neoplasm of prostate: Secondary | ICD-10-CM

## 2022-01-24 DIAGNOSIS — R5383 Other fatigue: Secondary | ICD-10-CM | POA: Diagnosis not present

## 2022-01-24 DIAGNOSIS — I10 Essential (primary) hypertension: Secondary | ICD-10-CM

## 2022-01-24 NOTE — Progress Notes (Signed)
?  ?I,Jana Robinson,acting as a scribe for Mila Merry, MD.,have documented all relevant documentation on the behalf of Mila Merry, MD,as directed by  Mila Merry, MD while in the presence of Mila Merry, MD. ? ? ? ?Established patient visit ? ? ?Patient: James Henry   DOB: 20-Dec-1970   [name]  MRN: [id number] ?Visit Date: 01/24/2022 ? ?Today's healthcare provider: Mila Merry, MD  ? ?Chief Complaint  ?Patient presents with  ? Hypertension  ? Hyperlipidemia  ? ?Subjective  ?  ?HPI  ?Hypertension, follow-up ? ?BP Readings from Last 3 Encounters:  ?01/24/22 132/76  ?05/02/21 130/71  ?03/31/21 (!) 150/80  ? Wt Readings from Last 3 Encounters:  ?01/24/22 244 lb 14.4 oz (111.1 kg)  ?07/19/21 218 lb 9.6 oz (99.2 kg)  ?06/02/21 214 lb (97.1 kg)  ?  ? ?He was last seen for hypertension 9 months ago.  ?BP at that visit was 130 71. Management since that visit includes no changes. ? ?He reports excellent compliance with treatment. ?He is not having side effects.  ?He is following a Regular diet. ?He is not exercising. ?He does smoke. ? ?Use of agents associated with hypertension: none.  ? ?Outside blood pressures are: does not take  ?Symptoms: ?No chest pain No chest pressure  ?No palpitations No syncope  ?No dyspnea No orthopnea  ?No paroxysmal nocturnal dyspnea No lower extremity edema  ? ? ?The 10-year ASCVD risk score (Arnett DK, et al., 2019) is: 9.1% ? ?---------------------------------------------------------------------------------------------------  ?Lipid/Cholesterol, Follow-up ? ?Last lipid panel Other pertinent labs  ?Lab Results  ?Component Value Date  ? CHOL 141 05/02/2021  ? HDL 37 (L) 05/02/2021  ? LDLCALC 68 05/02/2021  ? TRIG 219 (H) 05/02/2021  ? CHOLHDL 3.8 05/02/2021  ? Lab Results  ?Component Value Date  ? ALT 38 05/02/2021  ? AST 30 05/02/2021  ? PLT 251 05/02/2021  ? TSH 1.790 05/02/2021  ?  ? ?He was last seen for this 9 months ago.  ?Management since that visit includes no  changes. ? ?He reports excellent compliance with treatment. ?He is having side effects. "Exhaustion"  ? ?Symptoms: ?No chest pain No chest pressure/discomfort  ?No dyspnea No lower extremity edema  ?No numbness or tingling of extremity No orthopnea  ?No palpitations No paroxysmal nocturnal dyspnea  ?No speech difficulty No syncope  ? ? ?Current exercise: none ? ?The 10-year ASCVD risk score (Arnett DK, et al., 2019) is: 9.1% ? ?---------------------------------------------------------------------------------------------------  ? ?Medications: ?Outpatient Medications Prior to Visit  ?Medication Sig  ? gabapentin (NEURONTIN) 300 MG capsule Take 1 capsule (300 mg total) by mouth 2 (two) times daily.  ? lansoprazole (PREVACID) 30 MG capsule Take 30 mg by mouth 2 (two) times daily.   ? meloxicam (MOBIC) 15 MG tablet Take 1 tablet (15 mg total) by mouth daily.  ? metoprolol succinate (TOPROL-XL) 100 MG 24 hr tablet Take with or immediately following a meal.  ? omega-3 acid ethyl esters (LOVAZA) 1 G capsule Take 1 g by mouth 2 (two) times daily.  ? sildenafil (VIAGRA) 100 MG tablet TAKE 1 TABLET BY MOUTH ONCE DAILY AS NEEDED FOR ERECTILE DYSFUNCTION  ? simvastatin (ZOCOR) 40 MG tablet Take 1 tablet (40 mg total) by mouth at bedtime.  ? sucralfate (CARAFATE) 1 G tablet Take 1 g by mouth 4 (four) times daily.   ? ?No facility-administered medications prior to visit.  ? ? ? ? ?  Objective  ?  ?BP 132/76 (BP Location: Left Arm,  Patient Position: Sitting, Cuff Size: Large)   Pulse 86   Temp 98.7 ?F (37.1 ?C) (Oral)   Resp 16   Wt 244 lb 14.4 oz (111.1 kg)   SpO2 95%   BMI 37.24 kg/m?  ? ? ?Physical Exam  ? ? ?General: Appearance:    Obese male in no acute distress  ?Eyes:    PERRL, conjunctiva/corneas clear, EOM's intact       ?Lungs:     Clear to auscultation bilaterally, respirations unlabored  ?Heart:    Normal heart rate. Normal rhythm. No murmurs, rubs, or gallops.    ?MS:   All extremities are intact.     ?Neurologic:   Awake, alert, oriented x 3. No apparent focal neurological defect.   ?   ?  ? Assessment & Plan  ?  ? ?1. Essential (primary) hypertension ?Well controlled.  Continue current medications.   ?- Comprehensive metabolic panel ?- CBC ? ?2. Pure hypercholesterolemia ?He is tolerating simvastatin well with no adverse effects.   ?- Lipid panel ? ?3. Need for hepatitis C screening test ? ?- Hepatitis C antibody ? ?4. Prostate cancer screening ? ?- PSA Total (Reflex To Free) ([facility] only) ? ?5. Other fatigue ?Suspect OSA. He states he did have sleep study in his 30s showing some degree of apnea, but he never started CPAP. Advised will need sleep study if labs are not diagnostic.  ?- TSH ?- T4, free ? ?   ? ?The entirety of the information documented in the History of Present Illness, Review of Systems and Physical Exam were personally obtained by me. Portions of this information were initially documented by the CMA and reviewed by me for thoroughness and accuracy.   ? ? ?Mila Merry, MD  ?[facility] ?[phone] (phone) ?[phone] (fax) ? ?[facility] ?

## 2022-01-25 LAB — CBC
Hematocrit: 46.9 % (ref 37.5–51.0)
Hemoglobin: 15.8 g/dL (ref 13.0–17.7)
MCH: 29.8 pg (ref 26.6–33.0)
MCHC: 33.7 g/dL (ref 31.5–35.7)
MCV: 89 fL (ref 79–97)
Platelets: 204 10*3/uL (ref 150–450)
RBC: 5.3 x10E6/uL (ref 4.14–5.80)
RDW: 13.2 % (ref 11.6–15.4)
WBC: 4.9 10*3/uL (ref 3.4–10.8)

## 2022-01-25 LAB — COMPREHENSIVE METABOLIC PANEL
ALT: 115 IU/L — ABNORMAL HIGH (ref 0–44)
AST: 124 IU/L — ABNORMAL HIGH (ref 0–40)
Albumin/Globulin Ratio: 2 (ref 1.2–2.2)
Albumin: 5 g/dL — ABNORMAL HIGH (ref 3.8–4.9)
Alkaline Phosphatase: 95 IU/L (ref 44–121)
BUN/Creatinine Ratio: 15 (ref 9–20)
BUN: 15 mg/dL (ref 6–24)
Bilirubin Total: 0.5 mg/dL (ref 0.0–1.2)
CO2: 20 mmol/L (ref 20–29)
Calcium: 9.7 mg/dL (ref 8.7–10.2)
Chloride: 100 mmol/L (ref 96–106)
Creatinine, Ser: 1 mg/dL (ref 0.76–1.27)
Globulin, Total: 2.5 g/dL (ref 1.5–4.5)
Glucose: 98 mg/dL (ref 70–99)
Potassium: 4.2 mmol/L (ref 3.5–5.2)
Sodium: 136 mmol/L (ref 134–144)
Total Protein: 7.5 g/dL (ref 6.0–8.5)
eGFR: 91 mL/min/{1.73_m2} (ref >59)

## 2022-01-25 LAB — LIPID PANEL
Chol/HDL Ratio: 6.4 ratio — ABNORMAL HIGH (ref 0.0–5.0)
Cholesterol, Total: 179 mg/dL (ref 100–199)
HDL: 28 mg/dL — ABNORMAL LOW (ref >39)
LDL Chol Calc (NIH): 73 mg/dL (ref 0–99)
Triglycerides: 494 mg/dL — ABNORMAL HIGH (ref 0–149)
VLDL Cholesterol Cal: 78 mg/dL — ABNORMAL HIGH (ref 5–40)

## 2022-01-25 LAB — TSH: TSH: 3.55 u[IU]/mL (ref 0.450–4.500)

## 2022-01-25 LAB — T4, FREE: Free T4: 1.01 ng/dL (ref 0.82–1.77)

## 2022-01-25 LAB — HEPATITIS C ANTIBODY: Hep C Virus Ab: NONREACTIVE

## 2022-01-25 LAB — PSA TOTAL (REFLEX TO FREE): Prostate Specific Ag, Serum: 0.7 ng/mL (ref 0.0–4.0)

## 2022-02-02 ENCOUNTER — Other Ambulatory Visit: Payer: Self-pay | Admitting: Family Medicine

## 2022-02-02 ENCOUNTER — Encounter: Payer: Self-pay | Admitting: Family Medicine

## 2022-02-02 DIAGNOSIS — R7401 Elevation of levels of liver transaminase levels: Secondary | ICD-10-CM

## 2022-02-17 DIAGNOSIS — R7401 Elevation of levels of liver transaminase levels: Secondary | ICD-10-CM | POA: Diagnosis not present

## 2022-02-18 LAB — HEPATIC FUNCTION PANEL
ALT: 68 IU/L — ABNORMAL HIGH (ref 0–44)
AST: 46 IU/L — ABNORMAL HIGH (ref 0–40)
Albumin: 4.8 g/dL (ref 3.8–4.9)
Alkaline Phosphatase: 93 IU/L (ref 44–121)
Bilirubin Total: 0.4 mg/dL (ref 0.0–1.2)
Bilirubin, Direct: 0.12 mg/dL (ref 0.00–0.40)
Total Protein: 7.6 g/dL (ref 6.0–8.5)

## 2022-02-18 LAB — HEPATITIS A ANTIBODY, TOTAL: hep A Total Ab: NEGATIVE

## 2022-02-18 LAB — HEPATITIS B CORE ANTIBODY, TOTAL: Hep B Core Total Ab: NEGATIVE

## 2022-02-19 ENCOUNTER — Other Ambulatory Visit: Payer: Self-pay | Admitting: Family Medicine

## 2022-02-22 DIAGNOSIS — L570 Actinic keratosis: Secondary | ICD-10-CM | POA: Diagnosis not present

## 2022-02-22 DIAGNOSIS — L821 Other seborrheic keratosis: Secondary | ICD-10-CM | POA: Diagnosis not present

## 2022-02-22 DIAGNOSIS — X32XXXA Exposure to sunlight, initial encounter: Secondary | ICD-10-CM | POA: Diagnosis not present

## 2022-02-22 DIAGNOSIS — B354 Tinea corporis: Secondary | ICD-10-CM | POA: Diagnosis not present

## 2022-02-22 DIAGNOSIS — D2361 Other benign neoplasm of skin of right upper limb, including shoulder: Secondary | ICD-10-CM | POA: Diagnosis not present

## 2022-03-27 ENCOUNTER — Ambulatory Visit: Payer: Self-pay | Admitting: Urology

## 2022-03-29 ENCOUNTER — Ambulatory Visit
Admission: RE | Admit: 2022-03-29 | Discharge: 2022-03-29 | Disposition: A | Discharge status: Home / Self Care | Payer: BC Managed Care – PPO | Source: Ambulatory Visit | Attending: Urology | Admitting: Urology

## 2022-03-29 ENCOUNTER — Encounter: Payer: Self-pay | Admitting: Urology

## 2022-03-29 ENCOUNTER — Ambulatory Visit
Admission: RE | Admit: 2022-03-29 | Discharge: 2022-03-29 | Disposition: A | Discharge status: Home / Self Care | Payer: BC Managed Care – PPO | Attending: Urology | Admitting: Urology

## 2022-03-29 ENCOUNTER — Ambulatory Visit: Payer: BC Managed Care – PPO | Admitting: Urology

## 2022-03-29 VITALS — BP 134/66 | HR 91 | Ht 68.0 in | Wt 244.0 lb

## 2022-03-29 DIAGNOSIS — N200 Calculus of kidney: Secondary | ICD-10-CM | POA: Diagnosis not present

## 2022-03-29 DIAGNOSIS — N529 Male erectile dysfunction, unspecified: Secondary | ICD-10-CM

## 2022-03-29 MED ORDER — SILDENAFIL CITRATE 100 MG PO TABS: 100.0000 mg | ORAL_TABLET | Freq: Every day | ORAL | 1 refills | Status: DC | PRN

## 2022-03-29 NOTE — Progress Notes (Signed)
03/29/2022 8:20 AM   James Henry Mar 14, 1971 [id number]  Referring provider: Tamsen Roers, PA-C No address on file  Chief Complaint  Patient presents with   Nephrolithiasis    Urologic history: 1.  Bilateral nephrolithiasis CT June 2021 with bilateral renal calculi, largest measuring 4 mm  2.  Erectile dysfunction Sildenafil 100 mg   HPI: 51 y.o. male presents for annual follow-up.  Remains asymptomatic Denies flank, abdominal or pelvic pain No bothersome LUTS Sildenafil effective for ED   PMH: Past Medical History:  Diagnosis Date   Anxiety    GERD (gastroesophageal reflux disease)    Hyperlipidemia    Hypertension     Surgical History: Past Surgical History:  Procedure Laterality Date   COLONOSCOPY  05/2020    Home Medications:  Allergies as of 03/29/2022       Reactions   Codeine Itching   Gadolinium Derivatives Hives   The patient experienced an allergic reaction to the Magnevist (hives). The patient was taken to the Emergency Room for observation on 06/12/2000 during MRI Brain [facility]        Medication List        Accurate as of March 29, 2022  8:20 AM. If you have any questions, ask your nurse or doctor.          STOP taking these medications    meloxicam 15 MG tablet Commonly known as: Mobic Stopped by: Riki Altes, MD   sucralfate 1 g tablet Commonly known as: CARAFATE Stopped by: Riki Altes, MD       TAKE these medications    gabapentin 300 MG capsule Commonly known as: NEURONTIN Take 1 capsule (300 mg total) by mouth 2 (two) times daily.   lansoprazole 30 MG capsule Commonly known as: PREVACID Take 30 mg by mouth 2 (two) times daily.   metoprolol succinate 100 MG 24 hr tablet Commonly known as: TOPROL-XL Take with or immediately following a meal.   omega-3 acid ethyl esters 1 g capsule Commonly known as: LOVAZA Take 1 g by mouth 2 (two) times daily.   sildenafil 100 MG tablet Commonly  known as: VIAGRA TAKE 1 TABLET BY MOUTH ONCE DAILY AS NEEDED FOR ERECTILE DYSFUNCTION        Allergies:  Allergies  Allergen Reactions   Codeine Itching   Gadolinium Derivatives Hives    The patient experienced an allergic reaction to the Magnevist (hives). The patient was taken to the Emergency Room for observation on 06/12/2000 during MRI Brain [facility]    Family History: Family History  Problem Relation Age of Onset   Coronary artery disease Father    Cancer Father    Leukemia Father    Depression Brother    Heart attack Maternal Grandmother     Social History:  reports that he has been smoking cigarettes. He has been smoking an average of 3 packs per day. He uses smokeless tobacco. He reports current alcohol use. He reports that he does not use drugs.   Physical Exam: BP 134/66   Pulse 91   Ht 5\' 8"  (1.727 m)   Wt 244 lb (110.7 kg)   BMI 37.10 kg/m   Constitutional:  Alert and oriented, No acute distress. HEENT: [place] AT Respiratory: Normal respiratory effort, no increased work of breathing. Neurologic: Grossly intact, no focal deficits, moving all 4 extremities. Psychiatric: Normal mood and affect.   Pertinent Imaging:  K[facility] images performed today were personally reviewed and interpreted.  Stable left lower  pole renal calculi.  The right renal outline is obscured by overlying stool and bowel gas and no definite calculi are seen  Assessment & Plan:    1.  Bilateral nephrolithiasis Stable bilateral renal calculi Asymptomatic He requests to continue annual visits with KUB  2.  Erectile dysfunction Stable on sildenafil Refill sent to pharmacy   Riki Altes, MD  [facility] [address] [place], [place] [address] [phone]

## 2022-04-21 DIAGNOSIS — M542 Cervicalgia: Secondary | ICD-10-CM | POA: Diagnosis not present

## 2022-04-21 DIAGNOSIS — M5412 Radiculopathy, cervical region: Secondary | ICD-10-CM | POA: Diagnosis not present

## 2022-05-05 DIAGNOSIS — M5412 Radiculopathy, cervical region: Secondary | ICD-10-CM | POA: Diagnosis not present

## 2022-05-16 ENCOUNTER — Telehealth: Payer: Self-pay | Admitting: Family Medicine

## 2022-05-16 NOTE — Telephone Encounter (Signed)
[facility] faxed refill request for the following medications:  metoprolol succinate (TOPROL-XL) 100 MG 24 hr tablet   Please advise.

## 2022-05-17 ENCOUNTER — Other Ambulatory Visit: Payer: Self-pay

## 2022-05-17 DIAGNOSIS — I10 Essential (primary) hypertension: Secondary | ICD-10-CM

## 2022-05-17 MED ORDER — METOPROLOL SUCCINATE ER 100 MG PO TB24: ORAL_TABLET | ORAL | 0 refills | Status: DC

## 2022-05-17 NOTE — Telephone Encounter (Signed)
Pt due for f/u appt. #30 day courtesy refill sent to pharmacy.

## 2022-05-18 DIAGNOSIS — M5412 Radiculopathy, cervical region: Secondary | ICD-10-CM | POA: Diagnosis not present

## 2022-05-23 NOTE — Progress Notes (Signed)
I,Roshena L Chambers,acting as a scribe for Mila Merry, MD.,have documented all relevant documentation on the behalf of Mila Merry, MD,as directed by  Mila Merry, MD while in the presence of Mila Merry, MD.    Established patient visit   Patient: James Henry   DOB: Jun 07, 1971   [name]  MRN: [id number] Visit Date: 05/24/2022  Today's healthcare provider: Mila Merry, MD   Chief Complaint  Patient presents with   Hyperlipidemia   Subjective    HPI   Lipid/Cholesterol, Follow-up  Last lipid panel Other pertinent labs  Lab Results  Component Value Date   CHOL 179 01/24/2022   HDL 28 (L) 01/24/2022   LDLCALC 73 01/24/2022   TRIG 494 (H) 01/24/2022   CHOLHDL 6.4 (H) 01/24/2022   Lab Results  Component Value Date   ALT 68 (H) 02/17/2022   AST 46 (H) 02/17/2022   PLT 204 01/24/2022   TSH 3.550 01/24/2022     He was last seen for this 4 months ago.  Management since that visit includes putting simvastatin on hold due to elevated liver functions which nearly normalized after stopping simvastatin and rechecking on 02/17/2022.   He reports good compliance with treatment. He is not having side effects.   Symptoms: No chest pain No chest pressure/discomfort  No dyspnea No lower extremity edema  No numbness or tingling of extremity No orthopnea  No palpitations No paroxysmal nocturnal dyspnea  No speech difficulty No syncope   Current diet: in general, an "unhealthy" diet Current exercise: none  The 10-year ASCVD risk score (Arnett DK, et al., 2019) is: 15.9%  ---------------------------------------------------------------------------------------------------   Medications: Outpatient Medications Prior to Visit  Medication Sig   gabapentin (NEURONTIN) 300 MG capsule Take 1 capsule (300 mg total) by mouth 2 (two) times daily.   lansoprazole (PREVACID) 30 MG capsule Take 30 mg by mouth 2 (two) times daily.    MEGARED OMEGA-3 KRILL OIL PO  Take 1,000 mg by mouth 3 (three) times daily.   metoprolol succinate (TOPROL-XL) 100 MG 24 hr tablet Take with or immediately following a meal.   OVER THE COUNTER MEDICATION Taurursodiol (Tudca) 1 twice a day   sildenafil (VIAGRA) 100 MG tablet Take 1 tablet (100 mg total) by mouth daily as needed for erectile dysfunction.   No facility-administered medications prior to visit.    Review of Systems  Constitutional:  Negative for appetite change, chills and fever.  Respiratory:  Negative for chest tightness, shortness of breath and wheezing.   Cardiovascular:  Negative for chest pain and palpitations.  Gastrointestinal:  Negative for abdominal pain, nausea and vomiting.       Objective    BP 132/73 (BP Location: Right Arm, Patient Position: Sitting, Cuff Size: Large)   Pulse 81   Temp 98.2 F (36.8 C) (Oral)   Resp 14   Wt 247 lb (112 kg)   SpO2 98% Comment: room  air  BMI 37.56 kg/m    Physical Exam   General appearance: Obese male, cooperative and in no acute distress Head: Normocephalic, without obvious abnormality, atraumatic Respiratory: Respirations even and unlabored, normal respiratory rate Extremities: All extremities are intact.  Skin: Skin color, texture, turgor normal. No rashes seen  Psych: Appropriate mood and affect. Neurologic: Mental status: Alert, oriented to person, place, and time, thought content appropriate.    Assessment & Plan     1. Abnormal liver enzymes He does have known history of fatty liver, but recent elevations  occurred a few months after starting back on simvastatin and improved significantly after stopping simvastatin  - Hepatic function panel  He expressed interest in bempedoic acid. However we discussed benefits of other statins and that there other statins such as rosuvastatin that are not metabolized through the liver the way simvastatin is. Anticipate trial of rosuvastatin if liver functions are near normal.   2.  Elevated  cholesterol with elevated triglycerides  - Lipid panel - Hepatic function panel    3. Primary hypertension Well controlled.  Continue current medications.      The entirety of the information documented in the History of Present Illness, Review of Systems and Physical Exam were personally obtained by me. Portions of this information were initially documented by the CMA and reviewed by me for thoroughness and accuracy.     Mila Merry, MD  [facility] [phone] (phone) [phone] (fax)  [facility]

## 2022-05-24 ENCOUNTER — Ambulatory Visit: Payer: BC Managed Care – PPO | Admitting: Family Medicine

## 2022-05-24 ENCOUNTER — Encounter: Payer: Self-pay | Admitting: Family Medicine

## 2022-05-24 VITALS — BP 132/73 | HR 81 | Temp 98.2°F | Resp 14 | Wt 247.0 lb

## 2022-05-24 DIAGNOSIS — R748 Abnormal levels of other serum enzymes: Secondary | ICD-10-CM

## 2022-05-24 DIAGNOSIS — I10 Essential (primary) hypertension: Secondary | ICD-10-CM | POA: Diagnosis not present

## 2022-05-24 DIAGNOSIS — E782 Mixed hyperlipidemia: Secondary | ICD-10-CM | POA: Diagnosis not present

## 2022-05-24 DIAGNOSIS — Z23 Encounter for immunization: Secondary | ICD-10-CM | POA: Diagnosis not present

## 2022-05-25 ENCOUNTER — Other Ambulatory Visit: Payer: Self-pay | Admitting: Family Medicine

## 2022-05-25 DIAGNOSIS — E782 Mixed hyperlipidemia: Secondary | ICD-10-CM

## 2022-05-25 MED ORDER — ROSUVASTATIN CALCIUM 5 MG PO TABS: 5.0000 mg | ORAL_TABLET | Freq: Every day | ORAL | 1 refills | Status: DC

## 2022-05-26 LAB — HEPATIC FUNCTION PANEL
ALT: 69 IU/L — ABNORMAL HIGH (ref 0–44)
AST: 49 IU/L — ABNORMAL HIGH (ref 0–40)
Albumin: 4.9 g/dL (ref 3.8–4.9)
Alkaline Phosphatase: 86 IU/L (ref 44–121)
Bilirubin Total: 0.2 mg/dL (ref 0.0–1.2)
Bilirubin, Direct: 0.31 mg/dL (ref 0.00–0.40)
Total Protein: 7.2 g/dL (ref 6.0–8.5)

## 2022-05-26 LAB — LIPID PANEL
Chol/HDL Ratio: 11.5 ratio — ABNORMAL HIGH (ref 0.0–5.0)
Cholesterol, Total: 265 mg/dL — ABNORMAL HIGH (ref 100–199)
HDL: 23 mg/dL — ABNORMAL LOW (ref >39)
Triglycerides: 1099 mg/dL (ref 0–149)

## 2022-06-09 ENCOUNTER — Other Ambulatory Visit: Payer: Self-pay | Admitting: Family Medicine

## 2022-06-09 DIAGNOSIS — I10 Essential (primary) hypertension: Secondary | ICD-10-CM

## 2022-06-09 MED ORDER — METOPROLOL SUCCINATE ER 100 MG PO TB24: 100.0000 mg | ORAL_TABLET | Freq: Every day | ORAL | 3 refills | Status: DC

## 2022-06-29 ENCOUNTER — Other Ambulatory Visit: Payer: Self-pay | Admitting: Family Medicine

## 2022-06-29 ENCOUNTER — Encounter: Payer: Self-pay | Admitting: Family Medicine

## 2022-06-29 ENCOUNTER — Other Ambulatory Visit: Payer: Self-pay | Admitting: Unknown Physician Specialty

## 2022-06-29 DIAGNOSIS — D1039 Benign neoplasm of other parts of mouth: Secondary | ICD-10-CM | POA: Diagnosis not present

## 2022-06-29 DIAGNOSIS — E782 Mixed hyperlipidemia: Secondary | ICD-10-CM

## 2022-06-30 ENCOUNTER — Other Ambulatory Visit: Payer: Self-pay | Admitting: Urology

## 2022-06-30 DIAGNOSIS — N529 Male erectile dysfunction, unspecified: Secondary | ICD-10-CM

## 2022-07-03 LAB — SURGICAL PATHOLOGY

## 2022-07-05 DIAGNOSIS — E782 Mixed hyperlipidemia: Secondary | ICD-10-CM | POA: Diagnosis not present

## 2022-07-06 ENCOUNTER — Other Ambulatory Visit: Payer: Self-pay | Admitting: Family Medicine

## 2022-07-06 DIAGNOSIS — E782 Mixed hyperlipidemia: Secondary | ICD-10-CM

## 2022-07-06 LAB — COMPREHENSIVE METABOLIC PANEL
ALT: 38 IU/L (ref 0–44)
AST: 25 IU/L (ref 0–40)
Albumin/Globulin Ratio: 2 (ref 1.2–2.2)
Albumin: 4.9 g/dL (ref 3.8–4.9)
Alkaline Phosphatase: 89 IU/L (ref 44–121)
BUN/Creatinine Ratio: 17 (ref 9–20)
BUN: 18 mg/dL (ref 6–24)
Bilirubin Total: 0.3 mg/dL (ref 0.0–1.2)
CO2: 20 mmol/L (ref 20–29)
Calcium: 9.8 mg/dL (ref 8.7–10.2)
Chloride: 105 mmol/L (ref 96–106)
Creatinine, Ser: 1.06 mg/dL (ref 0.76–1.27)
Globulin, Total: 2.4 g/dL (ref 1.5–4.5)
Glucose: 94 mg/dL (ref 70–99)
Potassium: 4.5 mmol/L (ref 3.5–5.2)
Sodium: 142 mmol/L (ref 134–144)
Total Protein: 7.3 g/dL (ref 6.0–8.5)
eGFR: 84 mL/min/{1.73_m2} (ref >59)

## 2022-07-06 LAB — LIPID PANEL
Chol/HDL Ratio: 4 ratio (ref 0.0–5.0)
Cholesterol, Total: 125 mg/dL (ref 100–199)
HDL: 31 mg/dL — ABNORMAL LOW (ref >39)
LDL Chol Calc (NIH): 67 mg/dL (ref 0–99)
Triglycerides: 154 mg/dL — ABNORMAL HIGH (ref 0–149)
VLDL Cholesterol Cal: 27 mg/dL (ref 5–40)

## 2022-07-06 MED ORDER — ROSUVASTATIN CALCIUM 5 MG PO TABS: 5.0000 mg | ORAL_TABLET | Freq: Every day | ORAL | 4 refills | Status: DC

## 2022-08-09 DIAGNOSIS — R1310 Dysphagia, unspecified: Secondary | ICD-10-CM | POA: Diagnosis not present

## 2022-08-09 DIAGNOSIS — R1013 Epigastric pain: Secondary | ICD-10-CM | POA: Diagnosis not present

## 2022-08-09 DIAGNOSIS — K219 Gastro-esophageal reflux disease without esophagitis: Secondary | ICD-10-CM | POA: Diagnosis not present

## 2022-08-09 DIAGNOSIS — K580 Irritable bowel syndrome with diarrhea: Secondary | ICD-10-CM | POA: Diagnosis not present

## 2022-08-10 ENCOUNTER — Other Ambulatory Visit: Payer: Self-pay | Admitting: Urology

## 2022-08-10 ENCOUNTER — Other Ambulatory Visit: Payer: Self-pay | Admitting: Nurse Practitioner

## 2022-08-10 DIAGNOSIS — R1013 Epigastric pain: Secondary | ICD-10-CM | POA: Diagnosis not present

## 2022-08-10 DIAGNOSIS — K580 Irritable bowel syndrome with diarrhea: Secondary | ICD-10-CM | POA: Diagnosis not present

## 2022-08-10 DIAGNOSIS — N529 Male erectile dysfunction, unspecified: Secondary | ICD-10-CM

## 2022-08-10 DIAGNOSIS — K760 Fatty (change of) liver, not elsewhere classified: Secondary | ICD-10-CM | POA: Diagnosis not present

## 2022-08-15 ENCOUNTER — Ambulatory Visit
Admission: RE | Admit: 2022-08-15 | Discharge: 2022-08-15 | Disposition: A | Discharge status: Home / Self Care | Payer: BC Managed Care – PPO | Source: Ambulatory Visit | Attending: Nurse Practitioner | Admitting: Nurse Practitioner

## 2022-08-15 DIAGNOSIS — K760 Fatty (change of) liver, not elsewhere classified: Secondary | ICD-10-CM | POA: Insufficient documentation

## 2022-08-15 DIAGNOSIS — R1013 Epigastric pain: Secondary | ICD-10-CM | POA: Insufficient documentation

## 2022-08-15 DIAGNOSIS — K7689 Other specified diseases of liver: Secondary | ICD-10-CM | POA: Diagnosis not present

## 2022-09-13 ENCOUNTER — Other Ambulatory Visit: Payer: Self-pay | Admitting: Urology

## 2022-09-13 DIAGNOSIS — N529 Male erectile dysfunction, unspecified: Secondary | ICD-10-CM

## 2022-09-20 DIAGNOSIS — M5412 Radiculopathy, cervical region: Secondary | ICD-10-CM | POA: Diagnosis not present

## 2022-10-24 ENCOUNTER — Other Ambulatory Visit: Payer: Self-pay | Admitting: Urology

## 2022-10-24 DIAGNOSIS — N529 Male erectile dysfunction, unspecified: Secondary | ICD-10-CM

## 2022-11-08 DIAGNOSIS — K219 Gastro-esophageal reflux disease without esophagitis: Secondary | ICD-10-CM | POA: Diagnosis not present

## 2022-11-08 DIAGNOSIS — F17210 Nicotine dependence, cigarettes, uncomplicated: Secondary | ICD-10-CM | POA: Diagnosis not present

## 2022-11-08 DIAGNOSIS — K580 Irritable bowel syndrome with diarrhea: Secondary | ICD-10-CM | POA: Diagnosis not present

## 2022-11-08 DIAGNOSIS — K760 Fatty (change of) liver, not elsewhere classified: Secondary | ICD-10-CM | POA: Diagnosis not present

## 2022-11-17 DIAGNOSIS — J45909 Unspecified asthma, uncomplicated: Secondary | ICD-10-CM | POA: Diagnosis not present

## 2022-11-17 DIAGNOSIS — R0989 Other specified symptoms and signs involving the circulatory and respiratory systems: Secondary | ICD-10-CM | POA: Diagnosis not present

## 2022-11-17 DIAGNOSIS — F411 Generalized anxiety disorder: Secondary | ICD-10-CM | POA: Diagnosis not present

## 2022-11-17 DIAGNOSIS — F17200 Nicotine dependence, unspecified, uncomplicated: Secondary | ICD-10-CM | POA: Diagnosis not present

## 2022-11-27 DIAGNOSIS — I2089 Other forms of angina pectoris: Secondary | ICD-10-CM | POA: Diagnosis not present

## 2022-11-27 DIAGNOSIS — R0602 Shortness of breath: Secondary | ICD-10-CM | POA: Diagnosis not present

## 2022-11-27 DIAGNOSIS — G4733 Obstructive sleep apnea (adult) (pediatric): Secondary | ICD-10-CM | POA: Diagnosis not present

## 2022-11-27 DIAGNOSIS — R0989 Other specified symptoms and signs involving the circulatory and respiratory systems: Secondary | ICD-10-CM | POA: Diagnosis not present

## 2022-12-04 ENCOUNTER — Other Ambulatory Visit: Payer: Self-pay | Admitting: Urology

## 2022-12-04 DIAGNOSIS — N529 Male erectile dysfunction, unspecified: Secondary | ICD-10-CM

## 2022-12-06 ENCOUNTER — Ambulatory Visit
Admission: RE | Admit: 2022-12-06 | Discharge: 2022-12-06 | Disposition: A | Discharge status: Home / Self Care | Payer: BC Managed Care – PPO | Source: Ambulatory Visit | Attending: Acute Care | Admitting: Acute Care

## 2022-12-06 ENCOUNTER — Encounter: Payer: Self-pay | Admitting: Family Medicine

## 2022-12-06 DIAGNOSIS — J432 Centrilobular emphysema: Secondary | ICD-10-CM | POA: Insufficient documentation

## 2022-12-06 DIAGNOSIS — F17210 Nicotine dependence, cigarettes, uncomplicated: Secondary | ICD-10-CM

## 2022-12-06 DIAGNOSIS — I700 Atherosclerosis of aorta: Secondary | ICD-10-CM | POA: Insufficient documentation

## 2022-12-12 DIAGNOSIS — F17200 Nicotine dependence, unspecified, uncomplicated: Secondary | ICD-10-CM | POA: Diagnosis not present

## 2022-12-13 DIAGNOSIS — I2510 Atherosclerotic heart disease of native coronary artery without angina pectoris: Secondary | ICD-10-CM | POA: Diagnosis not present

## 2023-01-17 DIAGNOSIS — F17200 Nicotine dependence, unspecified, uncomplicated: Secondary | ICD-10-CM | POA: Diagnosis not present

## 2023-01-28 ENCOUNTER — Other Ambulatory Visit: Payer: Self-pay | Admitting: Urology

## 2023-01-28 DIAGNOSIS — N529 Male erectile dysfunction, unspecified: Secondary | ICD-10-CM

## 2023-02-12 ENCOUNTER — Ambulatory Visit: Payer: BC Managed Care – PPO | Admitting: Family Medicine

## 2023-02-12 VITALS — BP 137/85 | HR 81 | Ht 68.0 in | Wt 244.7 lb

## 2023-02-12 DIAGNOSIS — E7800 Pure hypercholesterolemia, unspecified: Secondary | ICD-10-CM

## 2023-02-12 DIAGNOSIS — Z125 Encounter for screening for malignant neoplasm of prostate: Secondary | ICD-10-CM

## 2023-02-12 DIAGNOSIS — I700 Atherosclerosis of aorta: Secondary | ICD-10-CM

## 2023-02-12 DIAGNOSIS — I10 Essential (primary) hypertension: Secondary | ICD-10-CM | POA: Diagnosis not present

## 2023-02-12 DIAGNOSIS — R7982 Elevated C-reactive protein (CRP): Secondary | ICD-10-CM

## 2023-02-12 NOTE — Progress Notes (Signed)
I,Sha'taria Tyson,acting as a Neurosurgeon for Mila Merry, MD.,have documented all relevant documentation on the behalf of Mila Merry, MD,as directed by  Mila Merry, MD while in the presence of Mila Merry, MD.   Established patient visit   Patient: James Henry   DOB: 28-Jan-1971   52 y.o. Male  MRN: [id number] Visit Date: 02/12/2023  Today's healthcare provider: Mila Merry, MD   No chief complaint on file.  Subjective    HPI   Lipid/Cholesterol, Follow-up  Last lipid panel Other pertinent labs  Lab Results  Component Value Date   CHOL 125 07/05/2022   HDL 31 (L) 07/05/2022   LDLCALC 67 07/05/2022   TRIG 154 (H) 07/05/2022   CHOLHDL 4.0 07/05/2022   Lab Results  Component Value Date   ALT 38 07/05/2022   AST 25 07/05/2022   PLT 204 01/24/2022   TSH 3.550 01/24/2022     He was last seen for this 6 months ago.  Management since that visit includes continue statin.  He was previously taking simvastatin but transaminases were elevated, and have normalized since change to rosuvastatin. He has had some trouble with diarrhea since change to rosuvastatin.   ---------------------------------------------------------------------------------------------------  Hypertension, follow-up  BP Readings from Last 3 Encounters:  05/24/22 132/73  03/29/22 134/66  01/24/22 132/76   Wt Readings from Last 3 Encounters:  12/06/22 240 lb (108.9 kg)  05/24/22 247 lb (112 kg)  03/29/22 244 lb (110.7 kg)     He was last seen for hypertension 6 months ago.  BP at that visit was 123/73. Management since that visit includes continue current treatment.  Outside blood pressures are not being checked. Symptoms: No chest pain No chest pressure/discomfort  No dyspnea No lower extremity edema  No numbness or tingling of extremity No orthopnea  No palpitations No paroxysmal nocturnal dyspnea  No speech difficulty No syncope   Pertinent labs Lab Results  Component  Value Date   CHOL 125 07/05/2022   HDL 31 (L) 07/05/2022   LDLCALC 67 07/05/2022   TRIG 154 (H) 07/05/2022   CHOLHDL 4.0 07/05/2022   Lab Results  Component Value Date   NA 142 07/05/2022   K 4.5 07/05/2022   CREATININE 1.06 07/05/2022   EGFR 84 07/05/2022   GLUCOSE 94 07/05/2022   TSH 3.550 01/24/2022     The ASCVD Risk score (Arnett DK, et al., 2019) failed to calculate for the following reasons:   The valid total cholesterol range is 130 to 320 mg/dL  ---------------------------------------------------------------------------------------------------   Medications: Outpatient Medications Prior to Visit  Medication Sig   gabapentin (NEURONTIN) 300 MG capsule Take 1 capsule (300 mg total) by mouth 2 (two) times daily.   lansoprazole (PREVACID) 30 MG capsule Take 30 mg by mouth 2 (two) times daily.    MEGARED OMEGA-3 KRILL OIL PO Take 1,000 mg by mouth 3 (three) times daily.   metoprolol succinate (TOPROL-XL) 100 MG 24 hr tablet Take 1 tablet (100 mg total) by mouth daily. Take with or immediately following a meal.   OVER THE COUNTER MEDICATION Taurursodiol (Tudca) 1 twice a day   rosuvastatin (CRESTOR) 5 MG tablet Take 1 tablet (5 mg total) by mouth daily.   sildenafil (VIAGRA) 100 MG tablet TAKE 1 TABLET BY MOUTH ONCE DAILY AS NEEDED FOR ERECTILE DYSFUNCTION   No facility-administered medications prior to visit.    Review of Systems  Constitutional:  Negative for appetite change, chills and fever.  Respiratory:  Negative for  chest tightness, shortness of breath and wheezing.   Cardiovascular:  Negative for chest pain and palpitations.  Gastrointestinal:  Negative for abdominal pain, nausea and vomiting.      Objective    BP 137/85 (BP Location: Right Arm, Patient Position: Sitting, Cuff Size: Large)   Pulse 81   Ht 5\' 8"  (1.727 m)   Wt 244 lb 11.2 oz (111 kg)   SpO2 100%   BMI 37.21 kg/m    Physical Exam   General appearance: Mildly obese male, cooperative and  in no acute distress Head: Normocephalic, without obvious abnormality, atraumatic Respiratory: Respirations even and unlabored, normal respiratory rate Extremities: All extremities are intact.  Skin: Skin color, texture, turgor normal. No rashes seen  Psych: Appropriate mood and affect. Neurologic: Mental status: Alert, oriented to person, place, and time, thought content appropriate.    Assessment & Plan     1. Pure hypercholesterolemia Doing well since changing from simvastatin to rosuvastatin due to elevated transaminases.  - Lipid panel - Comprehensive metabolic panel - CBC  2. Aortic atherosclerosis (HCC) Asymptomatic. Compliant with medication.  Continue aggressive risk factor modification.    3. Primary hypertension Well controlled.  Continue current medications.    4. Prostate cancer screening  - PSA Total (Reflex To Free) ([facility] only)  5. Elevated C-reactive protein (CRP) (Mildly elevated on labs done at GI) - C-reactive protein      The entirety of the information documented in the History of Present Illness, Review of Systems and Physical Exam were personally obtained by me. Portions of this information were initially documented by the CMA and reviewed by me for thoroughness and accuracy.     Mila Merry, MD  [facility] [phone] (phone) [phone] (fax)  [facility]

## 2023-02-13 LAB — COMPREHENSIVE METABOLIC PANEL
ALT: 65 IU/L — ABNORMAL HIGH (ref 0–44)
AST: 40 IU/L (ref 0–40)
Albumin/Globulin Ratio: 1.7 (ref 1.2–2.2)
Albumin: 4.6 g/dL (ref 3.8–4.9)
Alkaline Phosphatase: 102 IU/L (ref 44–121)
BUN/Creatinine Ratio: 17 (ref 9–20)
BUN: 15 mg/dL (ref 6–24)
Bilirubin Total: 0.4 mg/dL (ref 0.0–1.2)
CO2: 22 mmol/L (ref 20–29)
Calcium: 9.8 mg/dL (ref 8.7–10.2)
Chloride: 103 mmol/L (ref 96–106)
Creatinine, Ser: 0.86 mg/dL (ref 0.76–1.27)
Globulin, Total: 2.7 g/dL (ref 1.5–4.5)
Glucose: 97 mg/dL (ref 70–99)
Potassium: 4.3 mmol/L (ref 3.5–5.2)
Sodium: 140 mmol/L (ref 134–144)
Total Protein: 7.3 g/dL (ref 6.0–8.5)
eGFR: 104 mL/min/{1.73_m2} (ref >59)

## 2023-02-13 LAB — LIPID PANEL
Chol/HDL Ratio: 6.9 ratio — ABNORMAL HIGH (ref 0.0–5.0)
Cholesterol, Total: 193 mg/dL (ref 100–199)
HDL: 28 mg/dL — ABNORMAL LOW (ref >39)
LDL Chol Calc (NIH): 109 mg/dL — ABNORMAL HIGH (ref 0–99)
Triglycerides: 327 mg/dL — ABNORMAL HIGH (ref 0–149)
VLDL Cholesterol Cal: 56 mg/dL — ABNORMAL HIGH (ref 5–40)

## 2023-02-13 LAB — CBC
Hematocrit: 48.8 % (ref 37.5–51.0)
Hemoglobin: 16.4 g/dL (ref 13.0–17.7)
MCH: 29.2 pg (ref 26.6–33.0)
MCHC: 33.6 g/dL (ref 31.5–35.7)
MCV: 87 fL (ref 79–97)
Platelets: 266 10*3/uL (ref 150–450)
RBC: 5.61 x10E6/uL (ref 4.14–5.80)
RDW: 12.7 % (ref 11.6–15.4)
WBC: 8.9 10*3/uL (ref 3.4–10.8)

## 2023-02-13 LAB — PSA TOTAL (REFLEX TO FREE): Prostate Specific Ag, Serum: 0.4 ng/mL (ref 0.0–4.0)

## 2023-02-13 LAB — C-REACTIVE PROTEIN: CRP: 4 mg/L (ref 0–10)

## 2023-02-21 DIAGNOSIS — M5412 Radiculopathy, cervical region: Secondary | ICD-10-CM | POA: Diagnosis not present

## 2023-02-26 DIAGNOSIS — R0602 Shortness of breath: Secondary | ICD-10-CM | POA: Diagnosis not present

## 2023-02-26 DIAGNOSIS — E785 Hyperlipidemia, unspecified: Secondary | ICD-10-CM | POA: Diagnosis not present

## 2023-02-26 DIAGNOSIS — I2510 Atherosclerotic heart disease of native coronary artery without angina pectoris: Secondary | ICD-10-CM | POA: Diagnosis not present

## 2023-02-26 DIAGNOSIS — R0989 Other specified symptoms and signs involving the circulatory and respiratory systems: Secondary | ICD-10-CM | POA: Diagnosis not present

## 2023-02-28 DIAGNOSIS — L814 Other melanin hyperpigmentation: Secondary | ICD-10-CM | POA: Diagnosis not present

## 2023-02-28 DIAGNOSIS — A630 Anogenital (venereal) warts: Secondary | ICD-10-CM | POA: Diagnosis not present

## 2023-02-28 DIAGNOSIS — B356 Tinea cruris: Secondary | ICD-10-CM | POA: Diagnosis not present

## 2023-02-28 DIAGNOSIS — D2361 Other benign neoplasm of skin of right upper limb, including shoulder: Secondary | ICD-10-CM | POA: Diagnosis not present

## 2023-02-28 DIAGNOSIS — L821 Other seborrheic keratosis: Secondary | ICD-10-CM | POA: Diagnosis not present

## 2023-03-27 ENCOUNTER — Other Ambulatory Visit: Payer: Self-pay | Admitting: Urology

## 2023-03-27 DIAGNOSIS — N529 Male erectile dysfunction, unspecified: Secondary | ICD-10-CM

## 2023-03-29 ENCOUNTER — Ambulatory Visit: Payer: BC Managed Care – PPO | Admitting: Urology

## 2023-03-30 ENCOUNTER — Encounter: Payer: Self-pay | Admitting: Urology

## 2023-03-30 ENCOUNTER — Ambulatory Visit
Admission: RE | Admit: 2023-03-30 | Discharge: 2023-03-30 | Disposition: A | Discharge status: Home / Self Care | Payer: BC Managed Care – PPO | Source: Ambulatory Visit | Attending: Urology | Admitting: Urology

## 2023-03-30 ENCOUNTER — Ambulatory Visit
Admission: RE | Admit: 2023-03-30 | Discharge: 2023-03-30 | Disposition: A | Discharge status: Home / Self Care | Payer: BC Managed Care – PPO | Attending: Urology | Admitting: Urology

## 2023-03-30 ENCOUNTER — Ambulatory Visit: Payer: BC Managed Care – PPO | Admitting: Urology

## 2023-03-30 VITALS — BP 113/76 | HR 86 | Ht 68.0 in | Wt 242.0 lb

## 2023-03-30 DIAGNOSIS — N529 Male erectile dysfunction, unspecified: Secondary | ICD-10-CM

## 2023-03-30 DIAGNOSIS — N200 Calculus of kidney: Secondary | ICD-10-CM

## 2023-03-30 MED ORDER — SILDENAFIL CITRATE 100 MG PO TABS
100.0000 mg | ORAL_TABLET | Freq: Every day | ORAL | 0 refills | Status: AC | PRN
Start: 2023-03-30 — End: ?

## 2023-03-30 NOTE — Progress Notes (Signed)
I, Maysun L Gibbs,acting as a scribe for Riki Altes, MD.,have documented all relevant documentation on the behalf of Riki Altes, MD,as directed by  Riki Altes, MD while in the presence of Riki Altes, MD.  03/30/2023 9:29 AM   James Henry 12-Sep-1971 [id number]  Referring provider: Malva Limes, MD [address] [place],  [place] [address]  Chief Complaint  Patient presents with   Nephrolithiasis   Urologic history: 1.  Bilateral nephrolithiasis CT June 2021 with bilateral renal calculi, largest measuring 4 mm   2.  Erectile dysfunction Sildenafil 100 mg  HPI: James Henry is a 52 y.o. male presents for annual follow-up.   Remains asymptomatic Denies flank, abdominal or pelvic pain No bothersome LUTS Sildenafil effective for ED An abdominal ultrasound was performed November 2023 and no renal calculi were seen.  Small right simple renal cysts were noted measuring up to 1.9 cm   PMH: Past Medical History:  Diagnosis Date   GERD (gastroesophageal reflux disease)     Surgical History: Past Surgical History:  Procedure Laterality Date   COLONOSCOPY  05/2020    Home Medications:  Allergies as of 03/30/2023       Reactions   Codeine Itching   Gadolinium Derivatives Hives   The patient experienced an allergic reaction to the Magnevist (hives). The patient was taken to the Emergency Room for observation on 06/12/2000 during MRI Brain [facility]   Simvastatin    Elevated transaminases        Medication List        Accurate as of March 30, 2023  9:29 AM. If you have any questions, ask your nurse or doctor.          gabapentin 300 MG capsule Commonly known as: NEURONTIN Take 1 capsule (300 mg total) by mouth 2 (two) times daily.   lansoprazole 30 MG capsule Commonly known as: PREVACID Take 30 mg by mouth 2 (two) times daily.   MEGARED OMEGA-3 KRILL OIL PO Take 1,000 mg by mouth 3 (three) times daily.    metoprolol succinate 100 MG 24 hr tablet Commonly known as: TOPROL-XL Take 1 tablet (100 mg total) by mouth daily. Take with or immediately following a meal.   OVER THE COUNTER MEDICATION Taurursodiol (Tudca) 1 twice a day   rosuvastatin 5 MG tablet Commonly known as: Crestor Take 1 tablet (5 mg total) by mouth daily.   sildenafil 100 MG tablet Commonly known as: VIAGRA Take 1 tablet (100 mg total) by mouth daily as needed for erectile dysfunction.        Allergies:  Allergies  Allergen Reactions   Codeine Itching   Gadolinium Derivatives Hives    The patient experienced an allergic reaction to the Magnevist (hives). The patient was taken to the Emergency Room for observation on 06/12/2000 during MRI Brain [facility]   Simvastatin     Elevated transaminases    Family History: Family History  Problem Relation Age of Onset   Coronary artery disease Father    Cancer Father    Leukemia Father    Depression Brother    Heart attack Maternal Grandmother     Social History:  reports that he has been smoking cigarettes. He has been smoking an average of 3 packs per day. He uses smokeless tobacco. He reports current alcohol use. He reports that he does not use drugs.   Physical Exam: BP 113/76   Pulse 86   Ht 5'  8" (1.727 m)   Wt 242 lb (109.8 kg)   BMI 36.80 kg/m   Constitutional:  Alert and oriented, No acute distress. HEENT: [place] AT  Psychiatric: Normal mood and affect.   Pertinent Imaging: KUB images were personally reviewed and interpreted. Stable left lower pole renal calculi when compared with prior KUB June 2023. Punctate calcifications overlying the right renal outline.  Abdomen 1 view (KUB)  Narrative CLINICAL DATA:  Kidney stones.  Follow up  EXAM: ABDOMEN - 1 VIEW  COMPARISON:  KUB 03/31/21.  CT AP, 03/19/20  FINDINGS: The bowel gas pattern is normal. Relatively similar size, appearance and distribution of punctate LEFT renal stones. No radioopaque  RIGHT renal stones.  Prostatic calcifications.  no acute osseous findings.  IMPRESSION: Similar size, appearance and distribution of punctate LEFT renal stones.   Electronically Signed By: Roanna Banning M.D. On: 03/29/2022 09:12   Assessment & Plan:    1. Bilateral nephrolithiasis Stable/asymptomatic 1 year follow up with KUB  2. Erectile dysfunction Stable on Sildenafil - refills sent to pharmacy  I have reviewed the above documentation for accuracy and completeness, and I agree with the above.   Riki Altes, MD  [facility] [address], [address] [place], [place] [address] [phone]

## 2023-06-13 ENCOUNTER — Other Ambulatory Visit: Payer: Self-pay | Admitting: Family Medicine

## 2023-06-13 DIAGNOSIS — I10 Essential (primary) hypertension: Secondary | ICD-10-CM

## 2023-06-14 NOTE — Telephone Encounter (Signed)
Requested Prescriptions  Pending Prescriptions Disp Refills   metoprolol succinate (TOPROL-XL) 100 MG 24 hr tablet [Pharmacy Med Name: Metoprolol Succinate ER 100 MG Oral Tablet Extended Release 24 Hour] 90 tablet 0    Sig: TAKE 1 TABLET BY MOUTH ONCE DAILY WITH OR IMMEDIATELY FOLLOWING A MEAL     Cardiovascular:  Beta Blockers Passed - 06/13/2023  1:44 PM      Passed - Last BP in normal range    BP Readings from Last 1 Encounters:  03/30/23 113/76         Passed - Last Heart Rate in normal range    Pulse Readings from Last 1 Encounters:  03/30/23 86         Passed - Valid encounter within last 6 months    Recent Outpatient Visits           4 months ago Pure hypercholesterolemia   [facility] Malva Limes, MD   1 year ago Abnormal liver enzymes   [facility] Malva Limes, MD   1 year ago Essential (primary) hypertension   [facility] Malva Limes, MD   2 years ago Annual physical exam   [facility] Chrismon, Jodell Cipro, PA-C   2 years ago Combined fat and carbohydrate induced hyperlipemia   [facility] Chrismon, Jodell Cipro, PA-C       Future Appointments             In 9 months Stoioff, Verna Czech, MD [facility] [place]

## 2023-07-05 DIAGNOSIS — Z03818 Encounter for observation for suspected exposure to other biological agents ruled out: Secondary | ICD-10-CM | POA: Diagnosis not present

## 2023-07-05 DIAGNOSIS — J329 Chronic sinusitis, unspecified: Secondary | ICD-10-CM | POA: Diagnosis not present

## 2023-07-05 DIAGNOSIS — J988 Other specified respiratory disorders: Secondary | ICD-10-CM | POA: Diagnosis not present

## 2023-07-05 DIAGNOSIS — J439 Emphysema, unspecified: Secondary | ICD-10-CM | POA: Diagnosis not present

## 2023-07-12 ENCOUNTER — Other Ambulatory Visit: Payer: Self-pay | Admitting: Urology

## 2023-07-12 DIAGNOSIS — N529 Male erectile dysfunction, unspecified: Secondary | ICD-10-CM

## 2023-08-29 DIAGNOSIS — M47812 Spondylosis without myelopathy or radiculopathy, cervical region: Secondary | ICD-10-CM | POA: Diagnosis not present

## 2023-08-29 DIAGNOSIS — M5412 Radiculopathy, cervical region: Secondary | ICD-10-CM | POA: Diagnosis not present

## 2023-09-11 ENCOUNTER — Other Ambulatory Visit: Payer: Self-pay | Admitting: Urology

## 2023-09-11 ENCOUNTER — Other Ambulatory Visit: Payer: Self-pay | Admitting: Family Medicine

## 2023-09-11 DIAGNOSIS — I10 Essential (primary) hypertension: Secondary | ICD-10-CM

## 2023-09-11 DIAGNOSIS — N529 Male erectile dysfunction, unspecified: Secondary | ICD-10-CM

## 2023-09-13 NOTE — Telephone Encounter (Signed)
Courtesy refill. Patient will need an office visit for further refills. Requested Prescriptions  Pending Prescriptions Disp Refills   metoprolol succinate (TOPROL-XL) 100 MG 24 hr tablet [Pharmacy Med Name: Metoprolol Succinate ER 100 MG Oral Tablet Extended Release 24 Hour] 30 tablet 0    Sig: TAKE 1 TABLET BY MOUTH ONCE DAILY WITH  OR  IMMEDIATELY  FOLLOWING  A  MEAL     Cardiovascular:  Beta Blockers Failed - 09/11/2023  9:38 AM      Failed - Valid encounter within last 6 months    Recent Outpatient Visits           7 months ago Pure hypercholesterolemia   [facility] Malva Limes, MD   1 year ago Abnormal liver enzymes   [facility] Malva Limes, MD   1 year ago Essential (primary) hypertension   [facility] Malva Limes, MD   2 years ago Annual physical exam    [facility] Chrismon, Jodell Cipro, PA-C   3 years ago Combined fat and carbohydrate induced hyperlipemia   [facility] Chrismon, Jodell Cipro, PA-C       Future Appointments             In 6 months Stoioff, Verna Czech, MD [facility] [place]            Passed - Last BP in normal range    BP Readings from Last 1 Encounters:  03/30/23 113/76         Passed - Last Heart Rate in normal range    Pulse Readings from Last 1 Encounters:  03/30/23 86

## 2023-09-13 NOTE — Telephone Encounter (Signed)
Called pt - left message on machine to return call and schedule an office visit.

## 2023-09-30 ENCOUNTER — Emergency Department
Admission: EM | Admit: 2023-09-30 | Discharge: 2023-09-30 | Disposition: A | Discharge status: Home / Self Care | Payer: BC Managed Care – PPO | Attending: Emergency Medicine | Admitting: Emergency Medicine

## 2023-09-30 ENCOUNTER — Emergency Department: Payer: BC Managed Care – PPO

## 2023-09-30 ENCOUNTER — Other Ambulatory Visit: Payer: Self-pay

## 2023-09-30 ENCOUNTER — Encounter: Payer: Self-pay | Admitting: Emergency Medicine

## 2023-09-30 DIAGNOSIS — R109 Unspecified abdominal pain: Secondary | ICD-10-CM | POA: Diagnosis present

## 2023-09-30 DIAGNOSIS — N132 Hydronephrosis with renal and ureteral calculous obstruction: Secondary | ICD-10-CM | POA: Diagnosis not present

## 2023-09-30 DIAGNOSIS — N200 Calculus of kidney: Secondary | ICD-10-CM

## 2023-09-30 DIAGNOSIS — I10 Essential (primary) hypertension: Secondary | ICD-10-CM | POA: Diagnosis not present

## 2023-09-30 DIAGNOSIS — Z79899 Other long term (current) drug therapy: Secondary | ICD-10-CM | POA: Insufficient documentation

## 2023-09-30 DIAGNOSIS — D72829 Elevated white blood cell count, unspecified: Secondary | ICD-10-CM | POA: Diagnosis not present

## 2023-09-30 DIAGNOSIS — I2510 Atherosclerotic heart disease of native coronary artery without angina pectoris: Secondary | ICD-10-CM | POA: Diagnosis not present

## 2023-09-30 LAB — COMPREHENSIVE METABOLIC PANEL
ALT: 37 U/L (ref 0–44)
AST: 22 U/L (ref 15–41)
Albumin: 4.3 g/dL (ref 3.5–5.0)
Alkaline Phosphatase: 96 U/L (ref 38–126)
Anion gap: 10 (ref 5–15)
BUN: 18 mg/dL (ref 6–20)
CO2: 23 mmol/L (ref 22–32)
Calcium: 9.3 mg/dL (ref 8.9–10.3)
Chloride: 100 mmol/L (ref 98–111)
Creatinine, Ser: 1.26 mg/dL — ABNORMAL HIGH (ref 0.61–1.24)
GFR, Estimated: >60 mL/min (ref >60)
Glucose, Bld: 140 mg/dL — ABNORMAL HIGH (ref 70–99)
Potassium: 4.2 mmol/L (ref 3.5–5.1)
Sodium: 133 mmol/L — ABNORMAL LOW (ref 135–145)
Total Bilirubin: 1.1 mg/dL (ref <1.2)
Total Protein: 8.2 g/dL — ABNORMAL HIGH (ref 6.5–8.1)

## 2023-09-30 LAB — CBC WITH DIFFERENTIAL/PLATELET
Abs Immature Granulocytes: 0.06 10*3/uL (ref 0.00–0.07)
Basophils Absolute: 0.1 10*3/uL (ref 0.0–0.1)
Basophils Relative: 0 %
Eosinophils Absolute: 0.1 10*3/uL (ref 0.0–0.5)
Eosinophils Relative: 1 %
HCT: 49.7 % (ref 39.0–52.0)
Hemoglobin: 16.9 g/dL (ref 13.0–17.0)
Immature Granulocytes: 0 %
Lymphocytes Relative: 20 %
Lymphs Abs: 2.8 10*3/uL (ref 0.7–4.0)
MCH: 29.4 pg (ref 26.0–34.0)
MCHC: 34 g/dL (ref 30.0–36.0)
MCV: 86.6 fL (ref 80.0–100.0)
Monocytes Absolute: 1.1 10*3/uL — ABNORMAL HIGH (ref 0.1–1.0)
Monocytes Relative: 8 %
Neutro Abs: 10 10*3/uL — ABNORMAL HIGH (ref 1.7–7.7)
Neutrophils Relative %: 71 %
Platelets: 294 10*3/uL (ref 150–400)
RBC: 5.74 MIL/uL (ref 4.22–5.81)
RDW: 13 % (ref 11.5–15.5)
WBC: 14.1 10*3/uL — ABNORMAL HIGH (ref 4.0–10.5)
nRBC: 0 % (ref 0.0–0.2)

## 2023-09-30 LAB — URINALYSIS, ROUTINE W REFLEX MICROSCOPIC
Bacteria, UA: NONE SEEN
Bilirubin Urine: NEGATIVE
Glucose, UA: NEGATIVE mg/dL
Ketones, ur: NEGATIVE mg/dL
Leukocytes,Ua: NEGATIVE
Nitrite: NEGATIVE
Protein, ur: >=300 mg/dL — AB
Specific Gravity, Urine: 1.016 (ref 1.005–1.030)
pH: 6 (ref 5.0–8.0)

## 2023-09-30 LAB — LIPASE, BLOOD: Lipase: 37 U/L (ref 11–51)

## 2023-09-30 MED ORDER — OXYCODONE-ACETAMINOPHEN 5-325 MG PO TABS: 2.0000 | ORAL_TABLET | ORAL | 0 refills | Status: DC | PRN

## 2023-09-30 MED ORDER — ONDANSETRON HCL 4 MG/2ML IJ SOLN
4.0000 mg | Freq: Once | INTRAMUSCULAR | Status: AC
  Administered 2023-09-30: 4 mg via INTRAVENOUS
  Filled 2023-09-30: qty 2

## 2023-09-30 MED ORDER — SODIUM CHLORIDE 0.9 % IV BOLUS (SEPSIS)
1000.0000 mL | Freq: Once | INTRAVENOUS | Status: AC
  Administered 2023-09-30: 1000 mL via INTRAVENOUS

## 2023-09-30 MED ORDER — IOHEXOL 300 MG/ML  SOLN
100.0000 mL | Freq: Once | INTRAMUSCULAR | Status: AC | PRN
  Administered 2023-09-30: 100 mL via INTRAVENOUS

## 2023-09-30 MED ORDER — ONDANSETRON 4 MG PO TBDP: 4.0000 mg | ORAL_TABLET | Freq: Four times a day (QID) | ORAL | 0 refills | Status: DC | PRN

## 2023-09-30 MED ORDER — TAMSULOSIN HCL 0.4 MG PO CAPS: 0.4000 mg | ORAL_CAPSULE | Freq: Every day | ORAL | 0 refills | Status: DC

## 2023-09-30 MED ORDER — HYDROMORPHONE HCL 1 MG/ML IJ SOLN
1.0000 mg | Freq: Once | INTRAMUSCULAR | Status: AC
  Administered 2023-09-30: 1 mg via INTRAVENOUS
  Filled 2023-09-30: qty 1

## 2023-09-30 NOTE — ED Provider Notes (Signed)
[facility] Provider Note    Event Date/Time   First MD Initiated Contact with Patient 09/30/23 [phone]     (approximate)   History   Abdominal Pain   HPI  James Henry is a 52 y.o. male with history of diverticulitis, hypertension, hyperlipidemia, CAD who presents to the emergency department with complaints of left-sided abdominal pain, nausea that started 3 days ago.  No dysuria, hematuria, vomiting, diarrhea, bloody stools or melena.   History provided by patient.    Past Medical History:  Diagnosis Date   GERD (gastroesophageal reflux disease)     Past Surgical History:  Procedure Laterality Date   COLONOSCOPY  05/2020    MEDICATIONS:  Prior to Admission medications   Medication Sig Start Date End Date Taking? Authorizing Provider  gabapentin (NEURONTIN) 300 MG capsule Take 1 capsule (300 mg total) by mouth 2 (two) times daily. 05/02/21   Chrismon, Jodell Cipro, PA-C  lansoprazole (PREVACID) 30 MG capsule Take 30 mg by mouth 2 (two) times daily.  03/10/20   [provider]  MEGARED OMEGA-3 KRILL OIL PO Take 1,000 mg by mouth 3 (three) times daily.    [provider]  metoprolol succinate (TOPROL-XL) 100 MG 24 hr tablet TAKE 1 TABLET BY MOUTH ONCE DAILY WITH  OR  IMMEDIATELY  FOLLOWING  A  MEAL 09/13/23   Malva Limes, MD  OVER THE COUNTER MEDICATION Taurursodiol (Tudca) 1 twice a day    [provider]  rosuvastatin (CRESTOR) 5 MG tablet Take 1 tablet (5 mg total) by mouth daily. 07/06/22   Malva Limes, MD  sildenafil (VIAGRA) 100 MG tablet TAKE 1 TABLET BY MOUTH ONCE DAILY AS NEEDED FOR ERECTILE DYSFUNCTION 09/11/23   Riki Altes, MD    Physical Exam   Triage Vital Signs: ED Triage Vitals  Encounter Vitals Group     BP 09/30/23 0126 (!) 163/88     Systolic BP Percentile --      Diastolic BP Percentile --      Pulse Rate 09/30/23 0126 97     Resp 09/30/23 0126 18     Temp 09/30/23 0126 97.9 F  (36.6 C)     Temp Source 09/30/23 0126 Oral     SpO2 09/30/23 0126 98 %     Weight 09/30/23 0124 240 lb (108.9 kg)     Height 09/30/23 0124 5\' 8"  (1.727 m)     Head Circumference --      Peak Flow --      Pain Score 09/30/23 0126 8     Pain Loc --      Pain Education --      Exclude from Growth Chart --     Most recent vital signs: Vitals:   09/30/23 0126 09/30/23 0511  BP: (!) 163/88 (!) 149/86  Pulse: 97 100  Resp: 18 20  Temp: 97.9 F (36.6 C) 97.7 F (36.5 C)  SpO2: 98% 97%    CONSTITUTIONAL: Alert, responds appropriately to questions. Well-appearing; well-nourished HEAD: Normocephalic, atraumatic EYES: Conjunctivae clear, pupils appear equal, sclera nonicteric ENT: normal nose; moist mucous membranes NECK: Supple, normal ROM CARD: RRR; S1 and S2 appreciated RESP: Normal chest excursion without splinting or tachypnea; breath sounds clear and equal bilaterally; no wheezes, no rhonchi, no rales, no hypoxia or respiratory distress, speaking full sentences ABD/GI: Non-distended; soft, non-tender, no rebound, no guarding, no peritoneal signs BACK: The back appears normal EXT: Normal ROM in all joints; no deformity  noted, no edema SKIN: Normal color for age and race; warm; no rash on exposed skin NEURO: Moves all extremities equally, normal speech PSYCH: The patient's mood and manner are appropriate.   ED Results / Procedures / Treatments   LABS: (all labs ordered are listed, but only abnormal results are displayed) Labs Reviewed  COMPREHENSIVE METABOLIC PANEL - Abnormal; Notable for the following components:      Result Value   Sodium 133 (*)    Glucose, Bld 140 (*)    Creatinine, Ser 1.26 (*)    Total Protein 8.2 (*)    All other components within normal limits  URINALYSIS, ROUTINE W REFLEX MICROSCOPIC - Abnormal; Notable for the following components:   Color, Urine YELLOW (*)    APPearance CLEAR (*)    Hgb urine dipstick SMALL (*)    Protein, ur >=300 (*)     All other components within normal limits  CBC WITH DIFFERENTIAL/PLATELET - Abnormal; Notable for the following components:   WBC 14.1 (*)    Neutro Abs 10.0 (*)    Monocytes Absolute 1.1 (*)    All other components within normal limits  LIPASE, BLOOD     EKG:   RADIOLOGY: My personal review and interpretation of imaging: CT scan shows left-sided kidney stone.  I have personally reviewed all radiology reports.   CT ABDOMEN PELVIS W CONTRAST Result Date: 09/30/2023 CLINICAL DATA:  Left lower quadrant abdominal pain. History of diverticulitis. EXAM: CT ABDOMEN AND PELVIS WITH CONTRAST TECHNIQUE: Multidetector CT imaging of the abdomen and pelvis was performed using the standard protocol following bolus administration of intravenous contrast. RADIATION DOSE REDUCTION: This exam was performed according to the departmental dose-optimization program which includes automated exposure control, adjustment of the mA and/or kV according to patient size and/or use of iterative reconstruction technique. CONTRAST:  OMNIPAQUE IOHEXOL 300 MG/ML  SOLN COMPARISON:  03/19/2020 FINDINGS: Lower chest: Scarring noted within the posterior left base. Hepatobiliary: Subjective hepatic steatosis. Gallbladder is normal. No bile duct dilatation. Pancreas: Unremarkable. No pancreatic ductal dilatation or surrounding inflammatory changes. Spleen: Normal in size without focal abnormality. Adrenals/Urinary Tract: Normal adrenal glands. Several right kidney cysts noted, largest is off the lateral aspect of the right kidney measuring 1 cm. No follow-up imaging recommended. Several stones identified within the upper and lower pole of the right kidney which measure up to 3 mm. No right-sided hydronephrosis. Small stones within the inferior pole of the left kidney measure up to 3 mm. There is asymmetric left-sided nephromegaly and edema with moderate hydronephrosis. Left-sided hydroureter with Peri-ureteral fat stranding is  noted. Within the distal left ureter approximately, 1.4 cm from the bladder, is a 4 mm stone, image 77/2. Bladder appears normal. Stomach/Bowel: Stomach is within normal limits. No dilated loops of large or small bowel. Colonic diverticulosis which is most numerous at the level of the sigmoid colon. No signs of acute diverticulitis. Vascular/Lymphatic: Aortic atherosclerosis. No aneurysm. No signs of abdominopelvic adenopathy. Reproductive: Mild prostate gland enlargement. Other: No free fluid or fluid collections. No signs of pneumoperitoneum. Musculoskeletal: No acute or significant osseous findings. L5-S1 degenerative disc disease. IMPRESSION: 1. There is a 4 mm stone within the distal left ureter approximately, 1.4 cm from the bladder, with associated moderate left-sided hydronephrosis and hydroureter. 2. Bilateral nonobstructing renal calculi. 3. Colonic diverticulosis without signs of acute diverticulitis. 4. Subjective hepatic steatosis. 5.  Aortic Atherosclerosis (ICD10-I70.0). Electronically Signed   By: Signa Kell M.D.   On: 09/30/2023 05:23  PROCEDURES:  Critical Care performed: No      Procedures    IMPRESSION / MDM / ASSESSMENT AND PLAN / ED COURSE  I reviewed the triage vital signs and the nursing notes.    Patient here with left lower quadrant abdominal pain.    DIFFERENTIAL DIAGNOSIS (includes but not limited to):   Diverticulitis, colitis, bowel obstruction, kidney stone, UTI, pyelonephritis   Patient's presentation is most consistent with acute presentation with potential threat to life or bodily function.   PLAN: Labs, urine, CT obtained from triage.  Patient has a leukocytosis of 14,000.  Creatinine of 1.26.  Normal LFTs, lipase.  Urine shows no blood or sign of infection.  CT scan reviewed and interpreted by myself and the radiologist and shows a 4 mm distal left ureteral stone with moderate left-sided hydronephrosis and hydroureter.  He has diverticulosis  without diverticulitis.  Will give IV fluids, pain and nausea medicine and reassess.  Will discharge once pain has improved.  Will discharge with urology follow-up, prescription for pain and nausea medicine, Flomax.   MEDICATIONS GIVEN IN ED: Medications  sodium chloride 0.9 % bolus 1,000 mL (1,000 mLs Intravenous New Bag/Given 09/30/23 0541)  iohexol (OMNIPAQUE) 300 MG/ML solution 100 mL (100 mLs Intravenous Contrast Given 09/30/23 0433)  HYDROmorphone (DILAUDID) injection 1 mg (1 mg Intravenous Given 09/30/23 0541)  ondansetron (ZOFRAN) injection 4 mg (4 mg Intravenous Given 09/30/23 0541)     ED COURSE:  At this time, I do not feel there is any life-threatening condition present. I reviewed all nursing notes, vitals, pertinent previous records.  All lab and urine results, EKGs, imaging ordered have been independently reviewed and interpreted by myself.  I reviewed all available radiology reports from any imaging ordered this visit.  Based on my assessment, I feel the patient is safe to be discharged home without further emergent workup and can continue workup as an outpatient as needed. Discussed all findings, treatment plan as well as usual and customary return precautions.  They verbalize understanding and are comfortable with this plan.  Outpatient follow-up has been provided as needed.  All questions have been answered.    CONSULTS:  none   OUTSIDE RECORDS REVIEWED: Reviewed last cardiology note in May 2024.       FINAL CLINICAL IMPRESSION(S) / ED DIAGNOSES   Final diagnoses:  Kidney stone     Rx / DC Orders   ED Discharge Orders          Ordered    ondansetron (ZOFRAN-ODT) 4 MG disintegrating tablet  Every 6 hours PRN        09/30/23 0600    oxyCODONE-acetaminophen (PERCOCET) 5-325 MG tablet  Every 4 hours PRN        09/30/23 0600    tamsulosin (FLOMAX) 0.4 MG CAPS capsule  Daily        09/30/23 0600             Note:  This document was prepared using  Dragon voice recognition software and may include unintentional dictation errors.   Lyvonne Cassell, Layla Maw, DO 09/30/23 0600

## 2023-09-30 NOTE — ED Notes (Signed)
Reviewed D/C information with the patient, pt verbalized understanding. No additional concerns at this time.

## 2023-09-30 NOTE — Discharge Instructions (Signed)

## 2023-09-30 NOTE — ED Triage Notes (Signed)
Pt presents to the ED via POV with complaints of LLQ pain x 3 days. Hx of diverticulitis ~2-3 years ago. Pt notes taking Tylenol PTA without any improvement.  A&Ox4 at this time. Denies N/V/D, urinary sx, CP or SOB.

## 2023-10-05 ENCOUNTER — Other Ambulatory Visit: Payer: Self-pay | Admitting: Family Medicine

## 2023-10-05 DIAGNOSIS — E782 Mixed hyperlipidemia: Secondary | ICD-10-CM

## 2023-10-08 ENCOUNTER — Other Ambulatory Visit: Payer: Self-pay | Admitting: Family Medicine

## 2023-10-08 DIAGNOSIS — E782 Mixed hyperlipidemia: Secondary | ICD-10-CM

## 2023-10-08 NOTE — Telephone Encounter (Signed)
Medication Refill -  Most Recent Primary Care Visit:  Provider: Malva Limes  Department: [facility]  Visit Type: OFFICE VISIT  Date: 02/12/2023  Medication: rosuvastatin (CRESTOR) 5 MG tablet   Has the patient contacted their pharmacy? Yes (Agent: If no, request that the patient contact the pharmacy for the refill. If patient does not wish to contact the pharmacy document the reason why and proceed with request.) (Agent: If yes, when and what did the pharmacy advise?)  Is this the correct pharmacy for this prescription? Yes  [facility] [place], [place] - [address] [place] [address] Phone: [phone] Fax: [phone]   Has the prescription been filled recently? Yes  Is the patient out of the medication? Yes  Has the patient been seen for an appointment in the last year OR does the patient have an upcoming appointment? Yes  Can we respond through MyChart? Yes  Agent: Please be advised that Rx refills may take up to 3 business days. We ask that you follow-up with your pharmacy.

## 2023-10-09 ENCOUNTER — Ambulatory Visit
Admission: RE | Admit: 2023-10-09 | Discharge: 2023-10-09 | Disposition: A | Discharge status: Home / Self Care | Payer: BC Managed Care – PPO | Source: Ambulatory Visit | Attending: Physician Assistant | Admitting: Physician Assistant

## 2023-10-09 ENCOUNTER — Ambulatory Visit: Payer: BC Managed Care – PPO | Admitting: Physician Assistant

## 2023-10-09 ENCOUNTER — Ambulatory Visit
Admission: RE | Admit: 2023-10-09 | Discharge: 2023-10-09 | Disposition: A | Discharge status: Home / Self Care | Payer: BC Managed Care – PPO | Source: Ambulatory Visit | Attending: Physician Assistant | Admitting: *Deleted

## 2023-10-09 ENCOUNTER — Other Ambulatory Visit: Payer: Self-pay

## 2023-10-09 VITALS — BP 164/84 | HR 88 | Ht 68.0 in | Wt 252.0 lb

## 2023-10-09 DIAGNOSIS — N200 Calculus of kidney: Secondary | ICD-10-CM

## 2023-10-09 DIAGNOSIS — N201 Calculus of ureter: Secondary | ICD-10-CM | POA: Diagnosis not present

## 2023-10-09 LAB — MICROSCOPIC EXAMINATION

## 2023-10-09 LAB — URINALYSIS, COMPLETE
Bilirubin, UA: NEGATIVE
Glucose, UA: NEGATIVE
Ketones, UA: NEGATIVE
Nitrite, UA: NEGATIVE
Specific Gravity, UA: 1.01 (ref 1.005–1.030)
Urobilinogen, Ur: 0.2 mg/dL (ref 0.2–1.0)
pH, UA: 6.5 (ref 5.0–7.5)

## 2023-10-09 MED ORDER — OXYCODONE-ACETAMINOPHEN 5-325 MG PO TABS: 1.0000 | ORAL_TABLET | Freq: Four times a day (QID) | ORAL | 0 refills | Status: DC | PRN

## 2023-10-09 MED ORDER — TAMSULOSIN HCL 0.4 MG PO CAPS: 0.4000 mg | ORAL_CAPSULE | Freq: Every day | ORAL | 1 refills | Status: DC

## 2023-10-09 NOTE — Patient Instructions (Signed)
 For the next 4 weeks, please do the following: -Stay well hydrated -Take Flomax  0.4mg  daily (ok to increase to 0.8mg  daily as long as it doesn't make you dizzy) -Treat any pain with Advil (ibuprofen), Tylenol  (acetaminophen ), or Percocet -Treat any nausea with Zofran   I will plan to see you back in clinic in 4 weeks with another x-ray prior to see if you have passed your stone.  Please call our office immediately (we are open 8a-5p Monday-Friday) or go to the Emergency Department if you develop any of the following: -Fever/chills -Nausea and/or vomiting uncontrollable with Zofran  -Pain uncontrollable with Percocet

## 2023-10-09 NOTE — Progress Notes (Signed)
 10/09/2023 10:42 AM   James Henry 23-Apr-1971 [id number]  CC: Chief Complaint  Patient presents with   Nephrolithiasis    Follow up   HPI: James Henry is a 52 y.o. male with PMH nephrolithiasis and ED on sildenafil  who presents today for ED follow-up of a 4 mm distal left ureteral stone.   He was seen at the [facility] ED on 09/30/2023 with reports of left-sided abdominal pain and nausea x 3 days.  UA was bland.  CTAP with contrast showed a 4 mm distal left ureteral stone with moderate left hydroureteronephrosis.  There were bilateral nonobstructing renal stones as well.  Today he reports he continues to have left-sided abdominal pain and has not seen the stone pass.  He is taking Percocet once daily and it significantly helps.  He has been taking Flomax  and denies fevers.  Pain is currently 1/10 in intensity.  This is his first stone episode.  KUB today with a stable 4 mm distal left ureteral stone.  On review of prior CT, stone measures <1000 HU in density; skin-to-stone distance approximately 12 cm.  In-office UA today positive for 1+ blood, 2+ protein, and trace leukocytes; urine microscopy with 11-30 WBCs/HPF and 3-10 RBCs/HPF.  PMH: Past Medical History:  Diagnosis Date   GERD (gastroesophageal reflux disease)     Surgical History: Past Surgical History:  Procedure Laterality Date   COLONOSCOPY  05/2020    Home Medications:  Allergies as of 10/09/2023       Reactions   Codeine Itching   Gadolinium Derivatives Hives   The patient experienced an allergic reaction to the Magnevist (hives). The patient was taken to the Emergency Room for observation on 06/12/2000 during MRI Brain [facility]   Simvastatin     Elevated transaminases        Medication List        Accurate as of October 09, 2023 10:42 AM. If you have any questions, ask your nurse or doctor.          gabapentin  300 MG capsule Commonly known as: NEURONTIN  Take 1 capsule (300 mg  total) by mouth 2 (two) times daily.   lansoprazole 30 MG capsule Commonly known as: PREVACID Take 30 mg by mouth 2 (two) times daily.   MEGARED OMEGA-3 KRILL OIL PO Take 1,000 mg by mouth 3 (three) times daily.   metoprolol  succinate 100 MG 24 hr tablet Commonly known as: TOPROL -XL TAKE 1 TABLET BY MOUTH ONCE DAILY WITH  OR  IMMEDIATELY  FOLLOWING  A  MEAL   ondansetron  4 MG disintegrating tablet Commonly known as: ZOFRAN -ODT Take 1 tablet (4 mg total) by mouth every 6 (six) hours as needed for nausea or vomiting.   OVER THE COUNTER MEDICATION Taurursodiol (Tudca) 1 twice a day   oxyCODONE -acetaminophen  5-325 MG tablet Commonly known as: Percocet Take 2 tablets by mouth every 4 (four) hours as needed for severe pain (pain score 7-10).   rosuvastatin  5 MG tablet Commonly known as: CRESTOR  Take 1 tablet by mouth once daily   sildenafil  100 MG tablet Commonly known as: VIAGRA  TAKE 1 TABLET BY MOUTH ONCE DAILY AS NEEDED FOR ERECTILE DYSFUNCTION   tamsulosin  0.4 MG Caps capsule Commonly known as: Flomax  Take 1 capsule (0.4 mg total) by mouth daily.        Allergies:  Allergies  Allergen Reactions   Codeine Itching   Gadolinium Derivatives Hives    The patient experienced an allergic reaction to the Magnevist (hives). The patient  was taken to the Emergency Room for observation on 06/12/2000 during MRI Brain [facility]   Simvastatin      Elevated transaminases    Family History: Family History  Problem Relation Age of Onset   Coronary artery disease Father    Cancer Father    Leukemia Father    Depression Brother    Heart attack Maternal Grandmother     Social History:   reports that he has been smoking cigarettes. He uses smokeless tobacco. He reports current alcohol use. He reports that he does not use drugs.  Physical Exam: BP (!) 164/84   Pulse 88   Ht 5' 8 (1.727 m)   Wt 252 lb (114.3 kg)   BMI 38.32 kg/m   Constitutional:  Alert and oriented, no acute  distress, nontoxic appearing HEENT: [place], AT Cardiovascular: No clubbing, cyanosis, or edema Respiratory: Normal respiratory effort, no increased work of breathing Skin: No rashes, bruises or suspicious lesions Neurologic: Grossly intact, no focal deficits, moving all 4 extremities Psychiatric: Normal mood and affect  Laboratory Data: See Epic  Pertinent Imaging: KUB, 10/09/2023: See Epic  I personally reviewed the images referenced above and note a stable 4 mm distal left ureteral stone.  Assessment & Plan:   1. Left ureteral stone (Primary) 4 mm distal left ureteral stone, symptoms well-controlled.  UA is consistent with stone episode, low suspicion for UTI though will send for culture to confirm.  We discussed various treatment options for his stone including trial of passage vs. ESWL vs. ureteroscopy with laser lithotripsy and stent.   We specifically discussed that ESWL is a less invasive procedure that requires less anesthesia, however patients have to pass their residual stone fragments, which may take several weeks and be associated with continued renal colic. Additionally, we discussed the limitations of ESWL including the low, 10-20% chance of treatment failure requiring repeat ESWL versus ureteroscopy in the future. By comparison, ureteroscopy is a more invasive surgical procedure that requires more anesthesia, but we can potentially treat multiple stones in a single procedure. However, URS does require placement of a ureteral stent, which will remain in place for approximately 3-10 days and can be associated with flank pain, bladder pain, dysuria, urgency, frequency, urinary leakage, and gross hematuria.  Based on this conversation, he would like to proceed with trial of passage.  I will see him back in clinic next month to reassess.  We discussed return precautions in the interim including fever, uncontrolled pain, and uncontrolled nausea/vomiting.  If he fails, I suspect he will  like to proceed with shockwave.  Refilling his Flomax  and Percocet for symptom control. - Urinalysis, Complete - Abdomen 1 view (KUB); Future - tamsulosin  (FLOMAX ) 0.4 MG CAPS capsule; Take 1 capsule (0.4 mg total) by mouth daily.  Dispense: 30 capsule; Refill: 1 - oxyCODONE -acetaminophen  (PERCOCET) 5-325 MG tablet; Take 1-2 tablets by mouth every 6 (six) hours as needed for severe pain (pain score 7-10).  Dispense: 20 tablet; Refill: 0 - CULTURE, URINE COMPREHENSIVE   Return in about 1 month (around 11/09/2023) for Stone f/u with UA + KUB prior.  Lucie Hones, PA-C  [facility] [place] [address] [place], [place] [address] [phone]

## 2023-10-11 ENCOUNTER — Telehealth: Payer: Self-pay | Admitting: Physician Assistant

## 2023-10-11 NOTE — Telephone Encounter (Signed)
 Called pharmacy to confirm they got RX from 10/09/2023. Pharmacy states they got it but didn't filled it.  Informed pt.

## 2023-10-11 NOTE — Telephone Encounter (Signed)
 Patient called and states he needs his RX for Flomax to be sent over to [facility]acy [facility]. Patient said he went to pick up RX but only Percocet was available. Please advise Patient.

## 2023-10-12 LAB — CULTURE, URINE COMPREHENSIVE

## 2023-10-12 NOTE — Telephone Encounter (Signed)
 Refilled 10/09/2023 #[id number].

## 2023-10-22 ENCOUNTER — Other Ambulatory Visit: Payer: Self-pay | Admitting: Family Medicine

## 2023-10-22 DIAGNOSIS — I10 Essential (primary) hypertension: Secondary | ICD-10-CM

## 2023-11-13 ENCOUNTER — Ambulatory Visit
Admission: RE | Admit: 2023-11-13 | Discharge: 2023-11-13 | Disposition: A | Discharge status: Home / Self Care | Payer: BC Managed Care – PPO | Source: Ambulatory Visit | Attending: Physician Assistant | Admitting: *Deleted

## 2023-11-13 ENCOUNTER — Ambulatory Visit
Admission: RE | Admit: 2023-11-13 | Discharge: 2023-11-13 | Disposition: A | Discharge status: Home / Self Care | Payer: BC Managed Care – PPO | Source: Ambulatory Visit | Attending: Physician Assistant | Admitting: Physician Assistant

## 2023-11-13 ENCOUNTER — Ambulatory Visit (INDEPENDENT_AMBULATORY_CARE_PROVIDER_SITE_OTHER): Payer: BC Managed Care – PPO | Admitting: Physician Assistant

## 2023-11-13 VITALS — BP 123/76 | HR 87 | Ht 68.0 in | Wt 250.0 lb

## 2023-11-13 DIAGNOSIS — N201 Calculus of ureter: Secondary | ICD-10-CM

## 2023-11-13 DIAGNOSIS — Z87442 Personal history of urinary calculi: Secondary | ICD-10-CM | POA: Diagnosis not present

## 2023-11-13 DIAGNOSIS — Z09 Encounter for follow-up examination after completed treatment for conditions other than malignant neoplasm: Secondary | ICD-10-CM | POA: Diagnosis not present

## 2023-11-13 LAB — URINALYSIS, COMPLETE
Bilirubin, UA: NEGATIVE
Glucose, UA: NEGATIVE
Ketones, UA: NEGATIVE
Leukocytes,UA: NEGATIVE
Nitrite, UA: NEGATIVE
Specific Gravity, UA: 1.01 (ref 1.005–1.030)
Urobilinogen, Ur: 0.2 mg/dL (ref 0.2–1.0)
pH, UA: 6 (ref 5.0–7.5)

## 2023-11-13 LAB — MICROSCOPIC EXAMINATION

## 2023-11-13 NOTE — Progress Notes (Signed)
 11/13/2023 3:53 PM   James Henry 08-Aug-1971 [id number]  CC: Chief Complaint  Patient presents with   Nephrolithiasis   HPI: James Henry is a 53 y.o. male with PMH nephrolithiasis and ED on sildenafil  who presents today for follow-up of a 4 mm distal left ureteral stone.   I saw him in clinic most recently on 10/09/2023, at which point he elected to pursue trial of passage.  Today he reports he thinks he may have passed it spontaneously 2 days after his most recent office visit.  He was unable to capture the stone.  His pain resolved.  KUB today with interval resolution of the stone.  He is no longer taking Flomax  and noticed night sweats on it.  In-office UA today positive for trace lysed blood and 1+ protein; urine microscopy pan negative.  PMH: Past Medical History:  Diagnosis Date   GERD (gastroesophageal reflux disease)     Surgical History: Past Surgical History:  Procedure Laterality Date   COLONOSCOPY  05/2020    Home Medications:  Allergies as of 11/13/2023       Reactions   Codeine Itching   Gadolinium Derivatives Hives   The patient experienced an allergic reaction to the Magnevist (hives). The patient was taken to the Emergency Room for observation on 06/12/2000 during MRI Brain [facility]   Simvastatin     Elevated transaminases        Medication List        Accurate as of November 13, 2023  3:53 PM. If you have any questions, ask your nurse or doctor.          STOP taking these medications    lansoprazole 30 MG capsule Commonly known as: PREVACID   ondansetron  4 MG disintegrating tablet Commonly known as: ZOFRAN -ODT   tamsulosin  0.4 MG Caps capsule Commonly known as: Flomax        TAKE these medications    gabapentin  300 MG capsule Commonly known as: NEURONTIN  Take 1 capsule (300 mg total) by mouth 2 (two) times daily.   MEGARED OMEGA-3 KRILL OIL PO Take 1,000 mg by mouth 3 (three) times daily.   metoprolol   succinate 100 MG 24 hr tablet Commonly known as: TOPROL -XL TAKE 1 TABLET BY MOUTH ONCE DAILY WITH OR IMMEDIATELY FOLLOWING A MEAL   OVER THE COUNTER MEDICATION Taurursodiol (Tudca) 1 twice a day   oxyCODONE -acetaminophen  5-325 MG tablet Commonly known as: Percocet Take 1-2 tablets by mouth every 6 (six) hours as needed for severe pain (pain score 7-10).   rosuvastatin  5 MG tablet Commonly known as: CRESTOR  Take 1 tablet by mouth once daily   sildenafil  100 MG tablet Commonly known as: VIAGRA  TAKE 1 TABLET BY MOUTH ONCE DAILY AS NEEDED FOR ERECTILE DYSFUNCTION        Allergies:  Allergies  Allergen Reactions   Codeine Itching   Gadolinium Derivatives Hives    The patient experienced an allergic reaction to the Magnevist (hives). The patient was taken to the Emergency Room for observation on 06/12/2000 during MRI Brain [facility]   Simvastatin      Elevated transaminases    Family History: Family History  Problem Relation Age of Onset   Coronary artery disease Father    Cancer Father    Leukemia Father    Depression Brother    Heart attack Maternal Grandmother     Social History:   reports that he has been smoking cigarettes. He uses smokeless tobacco. He reports current alcohol use. He reports that  he does not use drugs.  Physical Exam: BP 123/76   Pulse 87   Ht 5' 8 (1.727 m)   Wt 250 lb (113.4 kg)   BMI 38.01 kg/m   Constitutional:  Alert and oriented, no acute distress, nontoxic appearing HEENT: [place], AT Cardiovascular: No clubbing, cyanosis, or edema Respiratory: Normal respiratory effort, no increased work of breathing Skin: No rashes, bruises or suspicious lesions Neurologic: Grossly intact, no focal deficits, moving all 4 extremities Psychiatric: Normal mood and affect  Laboratory Data: Results for orders placed or performed in visit on 11/13/23  Microscopic Examination   Collection Time: 11/13/23  3:15 PM   Urine  Result Value Ref Range   WBC, UA 0-5 0  - 5 /hpf   RBC, Urine 0-2 0 - 2 /hpf   Epithelial Cells (non renal) 0-10 0 - 10 /hpf   Bacteria, UA Few None seen/Few  Urinalysis, Complete   Collection Time: 11/13/23  3:15 PM  Result Value Ref Range   Specific Gravity, UA 1.010 1.005 - 1.030   pH, UA 6.0 5.0 - 7.5   Color, UA Yellow Yellow   Appearance Ur Clear Clear   Leukocytes,UA Negative Negative   Protein,UA 1+ (A) Negative/Trace   Glucose, UA Negative Negative   Ketones, UA Negative Negative   RBC, UA Trace (A) Negative   Bilirubin, UA Negative Negative   Urobilinogen, Ur 0.2 0.2 - 1.0 mg/dL   Nitrite, UA Negative Negative   Microscopic Examination See below:     Pertinent Imaging: KUB, 11/13/2023: See epic  I personally reviewed the images referenced above and note interval resolution of the distal left ureteral stone.  Assessment & Plan:   1. Left ureteral stone (Primary) Interval resolution of the stone on KUB today.  UA is bland and pain has resolved.  Okay to stop Flomax , may consider using it again in the future with acute stone episodes despite side effect of night sweats.  He may follow-up as scheduled. - Urinalysis, Complete  Return for Keep follow-up as scheduled.  Lucie Hones, PA-C  [facility] [place] [address] [place], [place] [address] [phone]

## 2023-11-20 ENCOUNTER — Other Ambulatory Visit: Payer: Self-pay | Admitting: Family Medicine

## 2023-11-20 DIAGNOSIS — I10 Essential (primary) hypertension: Secondary | ICD-10-CM

## 2023-12-06 ENCOUNTER — Other Ambulatory Visit: Payer: Self-pay

## 2023-12-06 DIAGNOSIS — Z122 Encounter for screening for malignant neoplasm of respiratory organs: Secondary | ICD-10-CM

## 2023-12-06 DIAGNOSIS — Z87891 Personal history of nicotine dependence: Secondary | ICD-10-CM

## 2023-12-06 DIAGNOSIS — F17210 Nicotine dependence, cigarettes, uncomplicated: Secondary | ICD-10-CM

## 2023-12-12 ENCOUNTER — Encounter: Payer: Self-pay | Admitting: Family Medicine

## 2023-12-17 ENCOUNTER — Ambulatory Visit
Admission: RE | Admit: 2023-12-17 | Discharge: 2023-12-17 | Disposition: A | Discharge status: Home / Self Care | Payer: BC Managed Care – PPO | Source: Ambulatory Visit | Attending: Family Medicine | Admitting: Family Medicine

## 2023-12-17 DIAGNOSIS — Z122 Encounter for screening for malignant neoplasm of respiratory organs: Secondary | ICD-10-CM | POA: Insufficient documentation

## 2023-12-17 DIAGNOSIS — F17210 Nicotine dependence, cigarettes, uncomplicated: Secondary | ICD-10-CM | POA: Diagnosis present

## 2023-12-17 DIAGNOSIS — Z87891 Personal history of nicotine dependence: Secondary | ICD-10-CM | POA: Diagnosis present

## 2023-12-19 ENCOUNTER — Ambulatory Visit: Payer: BC Managed Care – PPO | Admitting: Family Medicine

## 2023-12-19 VITALS — BP 133/70 | HR 81 | Temp 98.1°F | Resp 16 | Ht 68.0 in | Wt 241.0 lb

## 2023-12-19 DIAGNOSIS — Z23 Encounter for immunization: Secondary | ICD-10-CM | POA: Diagnosis not present

## 2023-12-19 DIAGNOSIS — E7800 Pure hypercholesterolemia, unspecified: Secondary | ICD-10-CM

## 2023-12-19 DIAGNOSIS — I700 Atherosclerosis of aorta: Secondary | ICD-10-CM

## 2023-12-19 DIAGNOSIS — E782 Mixed hyperlipidemia: Secondary | ICD-10-CM

## 2023-12-19 DIAGNOSIS — I10 Essential (primary) hypertension: Secondary | ICD-10-CM | POA: Diagnosis not present

## 2023-12-19 DIAGNOSIS — J432 Centrilobular emphysema: Secondary | ICD-10-CM

## 2023-12-19 DIAGNOSIS — Z0001 Encounter for general adult medical examination with abnormal findings: Secondary | ICD-10-CM

## 2023-12-19 DIAGNOSIS — F17210 Nicotine dependence, cigarettes, uncomplicated: Secondary | ICD-10-CM

## 2023-12-19 DIAGNOSIS — R739 Hyperglycemia, unspecified: Secondary | ICD-10-CM

## 2023-12-19 DIAGNOSIS — K760 Fatty (change of) liver, not elsewhere classified: Secondary | ICD-10-CM | POA: Diagnosis not present

## 2023-12-19 DIAGNOSIS — Z0000 Encounter for general adult medical examination without abnormal findings: Secondary | ICD-10-CM

## 2023-12-19 MED ORDER — BUPROPION HCL ER (SR) 150 MG PO TB12: ORAL_TABLET | ORAL | 3 refills | Status: AC

## 2023-12-19 NOTE — Progress Notes (Signed)
 Complete physical exam   Patient: James Henry   DOB: [birth year]   53 y.o. Male  MRN: [id number] Visit Date: 12/19/2023  Today's healthcare provider: Mila Merry, MD   Chief Complaint  Patient presents with   Annual Exam    CPE..Wants discuss meds in the visit.No other conerns.   Subjective    James Henry is a 53 y.o. male who presents today for a complete physical exam.  He reports consuming a  regular  diet.  He generally feels well. He reports sleeping fairly well.   He states he had labs done through work in January. PSA was 1. Something. A1c was high around 7.6. cholesterol was high. Has since been trying to eat a little healthier. Still smokes about 1 1/2 ppd. Had LDCT on 3/10, result is pending.   Past Medical History:  Diagnosis Date   GERD (gastroesophageal reflux disease)    Past Surgical History:  Procedure Laterality Date   COLONOSCOPY  05/2020   Social History   Socioeconomic History   Marital status: Married    Spouse name: Not on file   Number of children: Not on file   Years of education: Not on file   Highest education level: Associate degree: occupational, Scientist, product/process development, or vocational program  Occupational History   Not on file  Tobacco Use   Smoking status: Every Day    Current packs/day: 3.00    Types: Cigarettes   Smokeless tobacco: Current   Tobacco comments:    1 pack daily-06/01/21  Vaping Use   Vaping status: Never Used  Substance and Sexual Activity   Alcohol use: Yes    Alcohol/week: 0.0 standard drinks of alcohol    Comment: MODERATE USE- 21 BEERS EACH WEEKEND   Drug use: No   Sexual activity: Not on file  Other Topics Concern   Not on file  Social History Narrative   Not on file   Social Drivers of Health   Financial Resource Strain: Low Risk  (12/18/2023)   Overall Financial Resource Strain (CARDIA)    Difficulty of Paying Living Expenses: Not hard at all  Food Insecurity: No Food Insecurity  (12/18/2023)   Hunger Vital Sign    Worried About Running Out of Food in the Last Year: Never true    Ran Out of Food in the Last Year: Never true  Transportation Needs: No Transportation Needs (12/18/2023)   PRAPARE - Administrator, Civil Service (Medical): No    Lack of Transportation (Non-Medical): No  Physical Activity: Insufficiently Active (12/18/2023)   Exercise Vital Sign    Days of Exercise per Week: 2 days    Minutes of Exercise per Session: 20 min  Stress: No Stress Concern Present (12/18/2023)   [employer] of Occupational Health - Occupational Stress Questionnaire    Feeling of Stress : Only a little  Social Connections: Unknown (12/18/2023)   Social Connection and Isolation Panel [NHANES]    Frequency of Communication with Friends and Family: Patient declined    Frequency of Social Gatherings with Friends and Family: Patient declined    Attends Religious Services: 1 to 4 times per year    Active Member of [employer] or Organizations: No    Attends Engineer, structural: Not on file    Marital Status: Married  Catering manager Violence: Not on file   Family Status  Relation Name Status   Mother  Alive   Father  Deceased   Sister  Alive   Brother  Alive   MGM  Deceased  No partnership data on file   Family History  Problem Relation Age of Onset   Coronary artery disease Father    Cancer Father    Leukemia Father    Depression Brother    Heart attack Maternal Grandmother    Allergies  Allergen Reactions   Codeine Itching   Gadolinium Derivatives Hives    The patient experienced an allergic reaction to the Magnevist (hives). The patient was taken to the Emergency Room for observation on 06/12/2000 during MRI Brain [facility]   Simvastatin     Elevated transaminases   Tamsulosin Other (See Comments)    Night sweats, may use it in the future for acute stone episodes    Patient Care Team: Malva Limes, MD as PCP - General (Family  Medicine) Stanton Kidney, MD as Consulting Physician (Gastroenterology)   Medications: Outpatient Medications Prior to Visit  Medication Sig   gabapentin (NEURONTIN) 300 MG capsule Take 1 capsule (300 mg total) by mouth 2 (two) times daily.   MEGARED OMEGA-3 KRILL OIL PO Take 1,000 mg by mouth 3 (three) times daily.   metoprolol succinate (TOPROL-XL) 100 MG 24 hr tablet TAKE 1 TABLET BY MOUTH ONCE DAILY (TAKE  WITH  OR  IMMEDIATELY  FOLLOWING  A  MEAL)   rosuvastatin (CRESTOR) 5 MG tablet Take 1 tablet by mouth once daily   sildenafil (VIAGRA) 100 MG tablet TAKE 1 TABLET BY MOUTH ONCE DAILY AS NEEDED FOR ERECTILE DYSFUNCTION   No facility-administered medications prior to visit.    Review of Systems  Constitutional:  Negative for appetite change, chills and fever.  Respiratory:  Negative for chest tightness, shortness of breath and wheezing.   Cardiovascular:  Negative for chest pain and palpitations.  Gastrointestinal:  Negative for abdominal pain, nausea and vomiting.      Objective    BP 133/70 (BP Location: Left Arm, Patient Position: Sitting)   Pulse 81   Temp 98.1 F (36.7 C) (Oral)   Resp 16   Ht 5\' 8"  (1.727 m)   Wt 241 lb (109.3 kg)   SpO2 100%   BMI 36.64 kg/m    Physical Exam   General Appearance:    Obese male. Alert, cooperative, in no acute distress, appears stated age  Head:    Normocephalic, without obvious abnormality, atraumatic  Eyes:    PERRL, conjunctiva/corneas clear, EOM's intact, fundi    benign, both eyes       Ears:    Normal TM's and external ear canals, both ears  Nose:   Nares normal, septum midline, mucosa normal, no drainage   or sinus tenderness  Throat:   Lips, mucosa, and tongue normal; teeth and gums normal  Neck:   Supple, symmetrical, trachea midline, no adenopathy;       thyroid:  No enlargement/tenderness/nodules; no carotid   bruit or JVD  Back:     Symmetric, no curvature, ROM normal, no CVA tenderness  Lungs:     Clear to  auscultation bilaterally, respirations unlabored  Chest wall:    No tenderness or deformity  Heart:    Normal heart rate. Normal rhythm. No murmurs, rubs, or gallops.  S1 and S2 normal  Abdomen:     Soft, non-tender, bowel sounds active all four quadrants,    no masses, no organomegaly  Genitalia:    deferred  Rectal:    deferred  Extremities:   All extremities are intact.  No cyanosis or edema  Pulses:   2+ and symmetric all extremities  Skin:   Skin color, texture, turgor normal, no rashes or lesions  Lymph nodes:   Cervical, supraclavicular, and axillary nodes normal  Neurologic:   CNII-XII intact. Normal strength, sensation and reflexes      throughout     Last depression screening scores    12/19/2023    8:52 AM 02/12/2023    3:50 PM 01/24/2022    3:35 PM  PHQ 2/9 Scores  PHQ - 2 Score 0 0 2  PHQ- 9 Score 0 0 9   Last fall risk screening    02/12/2023    3:50 PM  Fall Risk   Falls in the past year? 0  Number falls in past yr: 0  Injury with Fall? 0  Risk for fall due to : No Fall Risks  Follow up Falls evaluation completed   Last Audit-C alcohol use screening    12/18/2023   12:05 PM  Alcohol Use Disorder Test (AUDIT)  1. How often do you have a drink containing alcohol? 2  2. How many drinks containing alcohol do you have on a typical day when you are drinking? 2  3. How often do you have six or more drinks on one occasion? 1  AUDIT-C Score 5   4. How often during the last year have you found that you were not able to stop drinking once you had started? 0  5. How often during the last year have you failed to do what was normally expected from you because of drinking? 0  6. How often during the last year have you needed a first drink in the morning to get yourself going after a heavy drinking session? 0  7. How often during the last year have you had a feeling of guilt of remorse after drinking? 0  8. How often during the last year have you been unable to remember what  happened the night before because you had been drinking? 0  9. Have you or someone else been injured as a result of your drinking? 0  10. Has a relative or friend or a doctor or another health worker been concerned about your drinking or suggested you cut down? 0  Alcohol Use Disorder Identification Test Final Score (AUDIT) 5      Patient-reported   A score of 3 or more in women, and 4 or more in men indicates increased risk for alcohol abuse, EXCEPT if all of the points are from question 1   No results found for any visits on 12/19/23.  Assessment & Plan    Routine Health Maintenance and Physical Exam  Exercise Activities and Dietary recommendations  Goals   None     Immunization History  Administered Date(s) Administered   Hepatitis B, ADULT 06/12/2014   PFIZER(Purple Top)SARS-COV-2 Vaccination 03/09/2020, 04/06/2020, 10/21/2020   PNEUMOCOCCAL CONJUGATE-20 12/19/2023   Tdap 06/06/2013, 12/19/2023    Health Maintenance  Topic Date Due   HIV Screening  Never done   Zoster Vaccines- Shingrix (1 of 2) Never done   INFLUENZA VACCINE  Never done   COVID-19 Vaccine (4 - 2024-25 season) 06/10/2023   Lung Cancer Screening  12/07/2023   Colonoscopy  05/11/2025   DTaP/Tdap/Td (3 - Td or Tdap) 12/18/2033   Pneumococcal Vaccine 6-74 Years old  Completed   Hepatitis C Screening  Completed   HPV VACCINES  Aged Out    Discussed health benefits of physical activity,  and encouraged him to engage in regular exercise appropriate for his age and condition.   2. Need for pneumococcal vaccination  - Pneumococcal conjugate vaccine 20-valent (Prevnar 20)  3. Need for prophylactic vaccination using tetanus and diphtheria toxoids adsorbed (Td) vaccine  - Tdap vaccine greater than or equal to 7yo IM  4. Primary hypertension Well controlled. Continue current medications.   - CBC with Differential/Platelet - Comprehensive metabolic panel  5. Fatty infiltration of liver Drinks about  once a month. Encouraged to minimize alcohol.   6. Elevated cholesterol with elevated triglycerides He is tolerating rosuvastatin but interested increasing dose since cholesterol was high on labs at work in January. Will check lipids today.   7. Smoking greater than 20 pack years Failed chantix. Thinking about trying nicotine patches. Is interested in a different prescription medication.  - buPROPion (WELLBUTRIN SR) 150 MG 12 hr tablet; 1 tablet daily for 3 days, then 1 tablet twice daily. Stop smoking 14 days after starting medication  Dispense: 60 tablet; Refill: 3  8. Aortic atherosclerosis (HCC) Asymptomatic. Compliant with medication.  Continue aggressive risk factor modification.    9. Centrilobular emphysema (HCC) Encourage smoking cessation.   10. Hyperglycemia Pt reports A1c was in the 7s when check at work in January.  - Hemoglobin A1c        Mila Merry, MD  [facility] [phone] (phone) [phone] (fax)  [facility]

## 2023-12-20 ENCOUNTER — Encounter: Payer: Self-pay | Admitting: Family Medicine

## 2023-12-20 ENCOUNTER — Other Ambulatory Visit: Payer: Self-pay | Admitting: Family Medicine

## 2023-12-20 DIAGNOSIS — R739 Hyperglycemia, unspecified: Secondary | ICD-10-CM | POA: Insufficient documentation

## 2023-12-20 DIAGNOSIS — E782 Mixed hyperlipidemia: Secondary | ICD-10-CM

## 2023-12-20 LAB — COMPREHENSIVE METABOLIC PANEL
ALT: 34 IU/L (ref 0–44)
AST: 27 IU/L (ref 0–40)
Albumin: 4.6 g/dL (ref 3.8–4.9)
Alkaline Phosphatase: 101 IU/L (ref 44–121)
BUN/Creatinine Ratio: 14 (ref 9–20)
BUN: 11 mg/dL (ref 6–24)
Bilirubin Total: 0.4 mg/dL (ref 0.0–1.2)
CO2: 21 mmol/L (ref 20–29)
Calcium: 9.6 mg/dL (ref 8.7–10.2)
Chloride: 102 mmol/L (ref 96–106)
Creatinine, Ser: 0.79 mg/dL (ref 0.76–1.27)
Globulin, Total: 2.4 g/dL (ref 1.5–4.5)
Glucose: 122 mg/dL — ABNORMAL HIGH (ref 70–99)
Potassium: 4.4 mmol/L (ref 3.5–5.2)
Sodium: 139 mmol/L (ref 134–144)
Total Protein: 7 g/dL (ref 6.0–8.5)
eGFR: 106 mL/min/{1.73_m2} (ref >59)

## 2023-12-20 LAB — CBC WITH DIFFERENTIAL/PLATELET
Basophils Absolute: 0.1 10*3/uL (ref 0.0–0.2)
Basos: 1 %
EOS (ABSOLUTE): 0.4 10*3/uL (ref 0.0–0.4)
Eos: 4 %
Hematocrit: 46.4 % (ref 37.5–51.0)
Hemoglobin: 15.7 g/dL (ref 13.0–17.7)
Immature Grans (Abs): 0 10*3/uL (ref 0.0–0.1)
Immature Granulocytes: 0 %
Lymphocytes Absolute: 3.5 10*3/uL — ABNORMAL HIGH (ref 0.7–3.1)
Lymphs: 41 %
MCH: 29.3 pg (ref 26.6–33.0)
MCHC: 33.8 g/dL (ref 31.5–35.7)
MCV: 87 fL (ref 79–97)
Monocytes Absolute: 0.7 10*3/uL (ref 0.1–0.9)
Monocytes: 8 %
Neutrophils Absolute: 3.9 10*3/uL (ref 1.4–7.0)
Neutrophils: 46 %
Platelets: 280 10*3/uL (ref 150–450)
RBC: 5.36 x10E6/uL (ref 4.14–5.80)
RDW: 13.3 % (ref 11.6–15.4)
WBC: 8.4 10*3/uL (ref 3.4–10.8)

## 2023-12-20 LAB — LIPID PANEL
Chol/HDL Ratio: 5.3 ratio — ABNORMAL HIGH (ref 0.0–5.0)
Cholesterol, Total: 149 mg/dL (ref 100–199)
HDL: 28 mg/dL — ABNORMAL LOW (ref >39)
LDL Chol Calc (NIH): 82 mg/dL (ref 0–99)
Triglycerides: 235 mg/dL — ABNORMAL HIGH (ref 0–149)
VLDL Cholesterol Cal: 39 mg/dL (ref 5–40)

## 2023-12-20 LAB — HEMOGLOBIN A1C
Est. average glucose Bld gHb Est-mCnc: 140 mg/dL
Hgb A1c MFr Bld: 6.5 % — ABNORMAL HIGH (ref 4.8–5.6)

## 2023-12-20 MED ORDER — ROSUVASTATIN CALCIUM 10 MG PO TABS: 10.0000 mg | ORAL_TABLET | Freq: Every day | ORAL | 1 refills | Status: DC

## 2023-12-25 ENCOUNTER — Telehealth: Payer: Self-pay | Admitting: Acute Care

## 2023-12-25 DIAGNOSIS — R911 Solitary pulmonary nodule: Secondary | ICD-10-CM

## 2023-12-25 NOTE — Telephone Encounter (Signed)
 Patient would like to know the results of his CT scan once they become available. [phone]

## 2023-12-26 NOTE — Telephone Encounter (Signed)
 Message sent to mychart. Advised results are still pending and we will contact him once received.

## 2023-12-30 ENCOUNTER — Other Ambulatory Visit: Payer: Self-pay | Admitting: Family Medicine

## 2023-12-30 DIAGNOSIS — I10 Essential (primary) hypertension: Secondary | ICD-10-CM

## 2024-01-11 ENCOUNTER — Ambulatory Visit: Payer: Self-pay

## 2024-01-11 DIAGNOSIS — R1310 Dysphagia, unspecified: Secondary | ICD-10-CM | POA: Diagnosis not present

## 2024-01-11 DIAGNOSIS — K219 Gastro-esophageal reflux disease without esophagitis: Secondary | ICD-10-CM | POA: Diagnosis present

## 2024-01-11 DIAGNOSIS — R079 Chest pain, unspecified: Secondary | ICD-10-CM | POA: Diagnosis not present

## 2024-01-15 ENCOUNTER — Other Ambulatory Visit: Payer: Self-pay | Admitting: Unknown Physician Specialty

## 2024-01-15 DIAGNOSIS — K1123 Chronic sialoadenitis: Secondary | ICD-10-CM

## 2024-01-21 NOTE — Telephone Encounter (Signed)
 Patient had annual LDCT on 12/17/2023 that resulted as an LR2. Results and pt's chart has been reviewed by Dara Ear NP. Due to larger noted lung ground glass nodule Isa Manuel would like patient to have a 6 month follow up scan to assure stability. Follow up LDCT is due 06/19/2024. Will need to call patient and review results and recommendations and send info to PCP.     IMPRESSION: *Lung-RADS 2, benign appearance or behavior. Continue annual screening with low-dose chest CT without contrast in 12 months. *Ill-defined ground-glass appearing poorly defined nodular area in the posteromedial right costophrenic sulcus measuring about 1 point 5 by 0.8 cm it appears to be present on prior examination but poorly defined this is probably due to the differences in respiratory volumes.     Electronically Signed   By: Fredrich Jefferson M.D.   On: 01/10/2024 10:02

## 2024-01-21 NOTE — Telephone Encounter (Addendum)
 Called and spoke to pt. Informed him of the findings on GG nodule. Patient verbalized understanding and is agreeable to 6 month follow up scan, order placed. Patient also states he is experiencing "numb chin syndrome" and is worried. Will notify PCP of patient's concerns. Patient also wanted SG to be aware.   Will forward to Sarah as FYI.

## 2024-01-24 ENCOUNTER — Other Ambulatory Visit: Payer: Self-pay | Admitting: Urology

## 2024-01-24 DIAGNOSIS — N529 Male erectile dysfunction, unspecified: Secondary | ICD-10-CM

## 2024-01-26 ENCOUNTER — Other Ambulatory Visit: Payer: Self-pay

## 2024-01-26 ENCOUNTER — Emergency Department

## 2024-01-26 ENCOUNTER — Emergency Department
Admission: EM | Admit: 2024-01-26 | Discharge: 2024-01-26 | Disposition: A | Discharge status: Home / Self Care | Attending: Emergency Medicine | Admitting: Emergency Medicine

## 2024-01-26 DIAGNOSIS — R791 Abnormal coagulation profile: Secondary | ICD-10-CM | POA: Insufficient documentation

## 2024-01-26 DIAGNOSIS — I10 Essential (primary) hypertension: Secondary | ICD-10-CM | POA: Insufficient documentation

## 2024-01-26 DIAGNOSIS — H538 Other visual disturbances: Secondary | ICD-10-CM | POA: Insufficient documentation

## 2024-01-26 DIAGNOSIS — R200 Anesthesia of skin: Secondary | ICD-10-CM

## 2024-01-26 DIAGNOSIS — Z79899 Other long term (current) drug therapy: Secondary | ICD-10-CM | POA: Insufficient documentation

## 2024-01-26 DIAGNOSIS — R202 Paresthesia of skin: Secondary | ICD-10-CM | POA: Insufficient documentation

## 2024-01-26 LAB — URINE DRUG SCREEN, QUALITATIVE (ARMC ONLY)
Amphetamines, Ur Screen: NOT DETECTED
Barbiturates, Ur Screen: NOT DETECTED
Benzodiazepine, Ur Scrn: NOT DETECTED
Cannabinoid 50 Ng, Ur ~~LOC~~: NOT DETECTED
Cocaine Metabolite,Ur ~~LOC~~: NOT DETECTED
MDMA (Ecstasy)Ur Screen: NOT DETECTED
Methadone Scn, Ur: NOT DETECTED
Opiate, Ur Screen: NOT DETECTED
Phencyclidine (PCP) Ur S: NOT DETECTED
Tricyclic, Ur Screen: NOT DETECTED

## 2024-01-26 LAB — COMPREHENSIVE METABOLIC PANEL WITH GFR
ALT: 29 U/L (ref 0–44)
AST: 23 U/L (ref 15–41)
Albumin: 4.5 g/dL (ref 3.5–5.0)
Alkaline Phosphatase: 74 U/L (ref 38–126)
Anion gap: 10 (ref 5–15)
BUN: 17 mg/dL (ref 6–20)
CO2: 24 mmol/L (ref 22–32)
Calcium: 9.5 mg/dL (ref 8.9–10.3)
Chloride: 102 mmol/L (ref 98–111)
Creatinine, Ser: 0.94 mg/dL (ref 0.61–1.24)
GFR, Estimated: >60 mL/min (ref >60)
Glucose, Bld: 149 mg/dL — ABNORMAL HIGH (ref 70–99)
Potassium: 3.8 mmol/L (ref 3.5–5.1)
Sodium: 136 mmol/L (ref 135–145)
Total Bilirubin: 0.8 mg/dL (ref 0.0–1.2)
Total Protein: 8 g/dL (ref 6.5–8.1)

## 2024-01-26 LAB — DIFFERENTIAL
Abs Immature Granulocytes: 0.09 10*3/uL — ABNORMAL HIGH (ref 0.00–0.07)
Basophils Absolute: 0 10*3/uL (ref 0.0–0.1)
Basophils Relative: 0 %
Eosinophils Absolute: 0 10*3/uL (ref 0.0–0.5)
Eosinophils Relative: 0 %
Immature Granulocytes: 1 %
Lymphocytes Relative: 12 %
Lymphs Abs: 1.8 10*3/uL (ref 0.7–4.0)
Monocytes Absolute: 0.5 10*3/uL (ref 0.1–1.0)
Monocytes Relative: 4 %
Neutro Abs: 12 10*3/uL — ABNORMAL HIGH (ref 1.7–7.7)
Neutrophils Relative %: 83 %

## 2024-01-26 LAB — PROTIME-INR
INR: 1 (ref 0.8–1.2)
Prothrombin Time: 13.6 s (ref 11.4–15.2)

## 2024-01-26 LAB — CBC
HCT: 47 % (ref 39.0–52.0)
Hemoglobin: 15.9 g/dL (ref 13.0–17.0)
MCH: 29.7 pg (ref 26.0–34.0)
MCHC: 33.8 g/dL (ref 30.0–36.0)
MCV: 87.9 fL (ref 80.0–100.0)
Platelets: 325 10*3/uL (ref 150–400)
RBC: 5.35 MIL/uL (ref 4.22–5.81)
RDW: 12.9 % (ref 11.5–15.5)
WBC: 14.4 10*3/uL — ABNORMAL HIGH (ref 4.0–10.5)
nRBC: 0 % (ref 0.0–0.2)

## 2024-01-26 LAB — ETHANOL: Alcohol, Ethyl (B): <10 mg/dL (ref <10)

## 2024-01-26 LAB — APTT: aPTT: 29 s (ref 24–36)

## 2024-01-26 MED ORDER — LORAZEPAM 2 MG/ML IJ SOLN: 1.0000 mg | INTRAMUSCULAR | Status: DC | PRN

## 2024-01-26 MED ORDER — PROCHLORPERAZINE EDISYLATE 10 MG/2ML IJ SOLN
10.0000 mg | Freq: Once | INTRAMUSCULAR | Status: AC
  Administered 2024-01-26: 10 mg via INTRAVENOUS
  Filled 2024-01-26: qty 2

## 2024-01-26 MED ORDER — DIPHENHYDRAMINE HCL 50 MG/ML IJ SOLN
25.0000 mg | Freq: Once | INTRAMUSCULAR | Status: AC
  Administered 2024-01-26: 25 mg via INTRAVENOUS
  Filled 2024-01-26: qty 1

## 2024-01-26 NOTE — ED Notes (Signed)
 Pt took self off monitor and called out for RN to take IV out. Pt refusing repeat VS.

## 2024-01-26 NOTE — ED Notes (Signed)
 EDP at bedside evaluating pt. Pt complains of shooting pain to face and also numbness to L face. Numbness crosses chin to R lower face. LKW was last night, woke up with symptoms. Respirations unlabored.

## 2024-01-26 NOTE — ED Notes (Signed)
 Pt in CT.

## 2024-01-26 NOTE — ED Triage Notes (Signed)
 Pt c/o of left-sided face numbness, throat numbness, left sided hand numbness, and blurry vision since 5am today, when he woke up.  Last known normal was 10:30pm last night when he went to bed. States he has some dizziness and a sharp, shooting pain going up the center of his chin.

## 2024-01-26 NOTE — ED Provider Notes (Signed)
 [facility] Provider Note    Event Date/Time   First MD Initiated Contact with Patient 01/26/24 [phone]     (approximate)   History   Chief Complaint No chief complaint on file.   HPI  James Henry is a 53 y.o. male with past medical history of hypertension, hyperlipidemia, GERD, and anxiety who presents to the ED complaining of numbness.  Patient reports that he woke this morning around 5 AM with numbness affecting the left side of his face, left arm, and left leg.  He has not noticed any facial droop and denies any numbness or weakness in the legs, does state that his vision has seemed blurry in both eyes.  He has not noticed any changes in his speech.  He does report feeling well when he went to bed around 10:30 PM last night.  He also complains of sharp and shooting pain under his chin and extending up his left face, has not had any sore throat or dental pain.  He denies any history of similar symptoms.      Physical Exam   Triage Vital Signs: ED Triage Vitals  Encounter Vitals Group     BP 01/26/24 0834 (!) 160/83     Systolic BP Percentile --      Diastolic BP Percentile --      Pulse Rate 01/26/24 0834 99     Resp 01/26/24 0834 20     Temp 01/26/24 0834 97.9 F (36.6 C)     Temp Source 01/26/24 0834 Oral     SpO2 01/26/24 0834 100 %     Weight 01/26/24 0835 240 lb (108.9 kg)     Height 01/26/24 0835 5\' 8"  (1.727 m)     Head Circumference --      Peak Flow --      Pain Score 01/26/24 0834 7     Pain Loc --      Pain Education --      Exclude from Growth Chart --     Most recent vital signs: Vitals:   01/26/24 1300 01/26/24 1316  BP: 127/68   Pulse: 67   Resp: 18   Temp:  98 F (36.7 C)  SpO2: 94%     Constitutional: Alert and oriented. Eyes: Conjunctivae are normal. Head: Atraumatic. Nose: No congestion/rhinnorhea. Mouth/Throat: Mucous membranes are moist.  Neck: No midline cervical spine tenderness to  palpation. Cardiovascular: Normal rate, regular rhythm. Grossly normal heart sounds.  2+ radial pulses bilaterally. Respiratory: Normal respiratory effort.  No retractions. Lungs CTAB. Gastrointestinal: Soft and nontender. No distention. Musculoskeletal: No lower extremity tenderness nor edema.  Neurologic:  Normal speech and language.  4+ out of 5 strength in left upper and lower extremities with no pronator drift.  5 out of 5 strength in right upper and lower extremities.  No facial droop noted.    ED Results / Procedures / Treatments   Labs (all labs ordered are listed, but only abnormal results are displayed) Labs Reviewed  CBC - Abnormal; Notable for the following components:      Result Value   WBC 14.4 (*)    All other components within normal limits  DIFFERENTIAL - Abnormal; Notable for the following components:   Neutro Abs 12.0 (*)    Abs Immature Granulocytes 0.09 (*)    All other components within normal limits  COMPREHENSIVE METABOLIC PANEL WITH GFR - Abnormal; Notable for the following components:   Glucose, Bld 149 (*)  All other components within normal limits  ETHANOL  PROTIME-INR  APTT  URINE DRUG SCREEN, QUALITATIVE ([facility] ONLY)     EKG  ED ECG REPORT I, Twilla Galea, the attending physician, personally viewed and interpreted this ECG.   Date: 01/26/2024  EKG Time: 9:21  Rate: 80  Rhythm: normal sinus rhythm  Axis: Normal  Intervals:none  ST&T Change: None  RADIOLOGY CT head reviewed and interpreted by me with no hemorrhage or midline shift.  PROCEDURES:  Critical Care performed: No  Procedures   MEDICATIONS ORDERED IN ED: Medications  LORazepam  (ATIVAN ) injection 1 mg (has no administration in time range)  prochlorperazine  (COMPAZINE ) injection 10 mg (10 mg Intravenous Given 01/26/24 0915)  diphenhydrAMINE  (BENADRYL ) injection 25 mg (25 mg Intravenous Given 01/26/24 0915)     IMPRESSION / MDM / ASSESSMENT AND PLAN / ED COURSE  I  reviewed the triage vital signs and the nursing notes.                              53 y.o. male with past medical history of hypertension, hyperlipidemia, and GERD who presents to the ED complaining of left-sided numbness since waking up this morning with pain underneath his left jaw.  Patient's presentation is most consistent with acute presentation with potential threat to life or bodily function.  Differential diagnosis includes, but is not limited to, stroke, TIA, cervical myelopathy, cervical radiculopathy, anemia, electrolyte abnormality, AKI, anxiety, complicated migraine.  Patient nontoxic-appearing and in no acute distress, vital signs are unremarkable.  He has subjective decrease sensation over the left side, subtle weakness noted in left upper and lower extremities but no facial droop noted.  No findings concerning for large vessel occlusion and he is outside the window for TNK.  We will check CT head, lab results are pending at this time.  No obvious source of pain in his mouth or around his jaw, will treat possible complicated migraine with IV Compazine  and Benadryl .  CT head is negative for acute process, MRI brain was also performed and negative for stroke.  MRI does show arachnoid herniation into the sella, which could indicate intracranial hypertension however doubt this would account for his symptoms today.  MRI of his cervical spine appears similar to previous with no acute finding to account for his symptoms.  Symptoms do seem improved on reassessment, patient ambulating without difficulty and strength intact throughout.  Given reassuring workup, he is appropriate for discharge home with outpatient neurology follow-up.  He was counseled to return to the ED for new or worsening symptoms, patient agrees with plan.      FINAL CLINICAL IMPRESSION(S) / ED DIAGNOSES   Final diagnoses:  Left sided numbness     Rx / DC Orders   ED Discharge Orders     None        Note:   This document was prepared using Dragon voice recognition software and may include unintentional dictation errors.   Twilla Galea, MD 01/26/24 1444

## 2024-01-30 ENCOUNTER — Ambulatory Visit
Admission: RE | Admit: 2024-01-30 | Discharge: 2024-01-30 | Disposition: A | Discharge status: Home / Self Care | Source: Ambulatory Visit | Attending: Unknown Physician Specialty | Admitting: Unknown Physician Specialty

## 2024-01-30 DIAGNOSIS — K1123 Chronic sialoadenitis: Secondary | ICD-10-CM

## 2024-03-18 ENCOUNTER — Other Ambulatory Visit: Payer: Self-pay | Admitting: *Deleted

## 2024-03-18 DIAGNOSIS — N200 Calculus of kidney: Secondary | ICD-10-CM

## 2024-03-18 DIAGNOSIS — N201 Calculus of ureter: Secondary | ICD-10-CM

## 2024-03-20 ENCOUNTER — Ambulatory Visit: Payer: Self-pay

## 2024-03-20 NOTE — Telephone Encounter (Signed)
 Reviewed. Agree with scheduled appointment

## 2024-03-20 NOTE — Telephone Encounter (Signed)
 Copied From CRM [id number]. Reason for Triage: Pt needs blood work he may have cancer. Says he cannot share symptoms because you will think I am crazy    No appt soon enough, seeking appt urgently.   Best contact:[phone]    -----------------------------------------------------------------------  From previous Reason for Contact - Scheduling:  Patient/patient representative is calling to schedule an appointment. Refer to attachments for appointment information.    Reason for Disposition  [1] Numbness or tingling in one or both feet AND [2] is a chronic symptom (recurrent or ongoing AND present > 4 weeks)    Numbness to chin for 3 months  Answer Assessment - Initial Assessment Questions 1. SYMPTOM: What is the main symptom you are concerned about? (e.g., weakness, numbness)     Numbness to chin 2. ONSET: When did this start? (minutes, hours, days; while sleeping)     3 months ago  3. LAST NORMAL: When was the last time you (the patient) were normal (no symptoms)?     3 months ago  5. CARDIAC SYMPTOMS: Have you had any of the following symptoms: chest pain, difficulty breathing, palpitations?     No 6. NEUROLOGIC SYMPTOMS: Have you had any of the following symptoms: headache, dizziness, vision loss, double vision, changes in speech, unsteady on your feet?     No 7. OTHER SYMPTOMS: Do you have any other symptoms?     Some increased anxiety  Protocols used: Neurologic Deficit-A-AH   FYI Only or Action Required?: FYI only for provider  Patient was last seen in primary care on 12/19/2023 by Lamon Pillow, MD. Called Nurse Triage reporting Numbness. Symptoms began several months ago. Interventions attempted: Other: Has been evaluated by neurologist. Symptoms are: unchanged.  Triage Disposition: See PCP When Office is Open (Within 3 Days)  Patient/caregiver understands and will follow disposition?:

## 2024-03-21 ENCOUNTER — Encounter: Payer: Self-pay | Admitting: Family Medicine

## 2024-03-21 ENCOUNTER — Ambulatory Visit: Admitting: Family Medicine

## 2024-03-21 VITALS — BP 124/70 | HR 99 | Temp 98.4°F | Ht 68.0 in | Wt 231.2 lb

## 2024-03-21 DIAGNOSIS — R7303 Prediabetes: Secondary | ICD-10-CM | POA: Diagnosis not present

## 2024-03-21 NOTE — Progress Notes (Signed)
 "     ACUTE VISIT   Patient: James Henry   DOB: [birth year]   53 y.o. Male  MRN: [id number]   PCP: Gasper Nancyann BRAVO, MD  Chief Complaint  Patient presents with   Acute Visit    Chin numbness been going on since the end of March wanted to specifically speak with provider regarding, A1C checked   Subjective    HPI HPI     Acute Visit    Additional comments: Chin numbness been going on since the end of March wanted to specifically speak with provider regarding, A1C checked      Last edited by Terrel Moats L on 03/21/2024  3:06 PM.       Discussed the use of AI scribe software for clinical note transcription with the patient, who gave verbal consent to proceed.  History of Present Illness James Henry is a 53 year old male who presents with numbness in his cheek and requests lab work.  He experiences numbness in his cheek, which he associates with anxiety and certain neck movements. He has a history of cervical spine issues, including problems with C3, C4, C5, and C6-7, which he believes may be contributing to his symptoms. Extensive imaging, including an MRI and CT scan of the brain, and an ultrasound of the neck and salivary glands, returned normal results. An ENT evaluation with a throat scope and oral tissue check found no abnormalities.  Imaging showed bilateral foraminal stenosis and right posterior lateral disc herniation with foraminal encroachment. He received a steroid injection in his neck and is currently taking gabapentin  300 mg twice daily, which has been helping with his symptoms.  He has significant health anxiety, particularly concerning the possibility of cancer, despite normal imaging results. He has had multiple scans this year, including a CT for a kidney stone in December, an annual CT scan of his lungs, and an upper endoscopy in April. He is concerned about the possibility of metastasized cancer. No symptoms such as bloody  diarrhea, abdominal pain, or coughing up blood.  He is following up on his A1c levels, which were previously in the diabetic range at 6.5, down from a higher level of 9. He has made dietary changes that have contributed to this improvement. He reports a weight loss of 9 pounds since March, currently weighing 231 pounds.     Medications: Outpatient Medications Prior to Visit  Medication Sig   gabapentin  (NEURONTIN ) 300 MG capsule Take 1 capsule (300 mg total) by mouth 2 (two) times daily.   MEGARED OMEGA-3 KRILL OIL PO Take 1,000 mg by mouth 3 (three) times daily.   metoprolol  succinate (TOPROL -XL) 100 MG 24 hr tablet TAKE 1 TABLET BY MOUTH ONCE DAILY (TAKE  WITH  OR  IMMEDIATELY  FOLLOWING  A  MEAL)   rosuvastatin  (CRESTOR ) 10 MG tablet Take 1 tablet (10 mg total) by mouth daily.   sildenafil  (VIAGRA ) 100 MG tablet TAKE 1 TABLET BY MOUTH ONCE DAILY AS NEEDED FOR ERECTILE DYSFUNCTION   buPROPion  (WELLBUTRIN  SR) 150 MG 12 hr tablet 1 tablet daily for 3 days, then 1 tablet twice daily. Stop smoking 14 days after starting medication   No facility-administered medications prior to visit.    Last metabolic panel Lab Results  Component Value Date   GLUCOSE 149 (H) 01/26/2024   NA 136 01/26/2024   K 3.8 01/26/2024   CL 102 01/26/2024   CO2 24 01/26/2024   BUN 17 01/26/2024   CREATININE  0.94 01/26/2024   GFRNONAA >60 01/26/2024   CALCIUM  9.5 01/26/2024   PROT 8.0 01/26/2024   ALBUMIN 4.5 01/26/2024   LABGLOB 2.4 12/19/2023   AGRATIO 1.7 02/12/2023   BILITOT 0.8 01/26/2024   ALKPHOS 74 01/26/2024   AST 23 01/26/2024   ALT 29 01/26/2024   ANIONGAP 10 01/26/2024   Last lipids Lab Results  Component Value Date   CHOL 149 12/19/2023   HDL 28 (L) 12/19/2023   LDLCALC 82 12/19/2023   TRIG 235 (H) 12/19/2023   CHOLHDL 5.3 (H) 12/19/2023   Last hemoglobin A1c Lab Results  Component Value Date   HGBA1C 6.5 (H) 12/19/2023   Last thyroid  functions Lab Results  Component Value  Date   TSH 3.550 01/24/2022        Objective    BP 124/70 (BP Location: Left Arm, Patient Position: Sitting, Cuff Size: Normal)   Pulse 99   Temp 98.4 F (36.9 C) (Oral)   Ht 5' 8 (1.727 m)   Wt 231 lb 3.2 oz (104.9 kg)   SpO2 100%   BMI 35.15 kg/m  BP Readings from Last 3 Encounters:  03/21/24 124/70  01/26/24 127/68  12/19/23 133/70   Wt Readings from Last 3 Encounters:  03/21/24 231 lb 3.2 oz (104.9 kg)  01/26/24 240 lb (108.9 kg)  12/19/23 241 lb (109.3 kg)      Physical Exam   Physical Exam MEASUREMENTS: Weight- 231. GENERAL: No acute distress, no facial deformities. HEENT: Oral cavity without lesions. NECK: No cervical lymphadenopathy. NEUROLOGICAL: Cranial nerves grossly intact.   No results found for any visits on 03/21/24.   Assessment & Plan      Assessment & Plan Cervical Radiculopathy Cervical radiculopathy involving C3, C4, C5, and C7 vertebrae, causing numbness in the cheek and chin. Imaging reveals osteophytes, protrusions, and foraminal stenosis, potentially compressing C7 nerves. Symptoms persist for three months. Gabapentin  300 mg twice daily has reduced symptoms, with plans to increase dosage. Under neurologist and orthopedist care, with follow-up in mid-July. Insurance approval for further imaging may be challenging; current management focuses on symptom control with gabapentin . - Continue gabapentin  300 mg twice daily and increase dosage as planned. - Follow up with neurologist in mid-July.  Health Anxiety Significant health anxiety, particularly about cancer, despite normal imaging and tests. Interested in mental health support for anxiety management. Referral to [facility] planned. - Refer to Arti Kapoor for mental health support and anxiety management.  Type 2 Diabetes Mellitus Last A1c was 6.5%, indicating well-controlled diabetes. Dietary changes have improved glycemic control. Due for an A1c check. - Order A1c test to assess current  glycemic control.  General Health Maintenance Weight decreased from 241 lbs in March to 231 lbs currently. Blood pressure is well-controlled. Actively managing diabetes through dietary changes. - Continue monitoring weight and encourage healthy lifestyle choices.      No follow-ups on file.        Rockie Agent, MD  [facility] [phone] (phone) [phone] (fax)  [facility] "

## 2024-03-22 LAB — HEMOGLOBIN A1C
Est. average glucose Bld gHb Est-mCnc: 128 mg/dL
Hgb A1c MFr Bld: 6.1 % — ABNORMAL HIGH (ref 4.8–5.6)

## 2024-03-24 ENCOUNTER — Ambulatory Visit: Payer: Self-pay | Admitting: Family Medicine

## 2024-03-28 ENCOUNTER — Ambulatory Visit
Admission: RE | Admit: 2024-03-28 | Discharge: 2024-03-28 | Disposition: A | Discharge status: Home / Self Care | Attending: Urology | Admitting: Urology

## 2024-03-28 ENCOUNTER — Encounter: Payer: Self-pay | Admitting: Urology

## 2024-03-28 ENCOUNTER — Ambulatory Visit: Payer: Self-pay | Admitting: Urology

## 2024-03-28 ENCOUNTER — Ambulatory Visit
Admission: RE | Admit: 2024-03-28 | Discharge: 2024-03-28 | Disposition: A | Discharge status: Home / Self Care | Source: Ambulatory Visit | Attending: Urology | Admitting: Urology

## 2024-03-28 VITALS — BP 128/79 | HR 80 | Ht 68.0 in | Wt 231.0 lb

## 2024-03-28 DIAGNOSIS — N200 Calculus of kidney: Secondary | ICD-10-CM | POA: Insufficient documentation

## 2024-03-28 DIAGNOSIS — N529 Male erectile dysfunction, unspecified: Secondary | ICD-10-CM | POA: Diagnosis not present

## 2024-03-28 MED ORDER — TADALAFIL 20 MG PO TABS: ORAL_TABLET | ORAL | 0 refills | Status: DC

## 2024-03-28 NOTE — Progress Notes (Signed)
 03/28/2024 11:29 AM   James Henry 03-28-1971 [id number]  Referring provider: Lamon Pillow, MD [address] [place],  [place] [address]  Chief Complaint  Patient presents with   Nephrolithiasis   Urologic history: 1.  Bilateral nephrolithiasis CT June 2021 with bilateral renal calculi, largest measuring 4 mm   2.  Erectile dysfunction Sildenafil  100 mg  HPI: James Henry is a 53 y.o. male presents for annual follow-up.   ED visit 09/30/2023 for left lower quadrant abdominal pain.  CT abdomen pelvis with contrast with bilateral, nonobstructing renal calculi and a 4 mm left distal ureteral calculus PA visit here 10/09/2023 and on follow-up 03/07/2025he had passed a stone Presently asymptomatic and denies flank, abdominal or pelvic pain No bothersome LUTS Does not feel sildenafil  is as effective for his ED and has requested a trial of tadalafil   PMH: Past Medical History:  Diagnosis Date   Arthritis    GERD (gastroesophageal reflux disease)    Hyperlipidemia    Hypertension     Surgical History: Past Surgical History:  Procedure Laterality Date   COLONOSCOPY  05/2020    Home Medications:  Allergies as of 03/28/2024       Reactions   Codeine Itching   Gadolinium Derivatives Hives   The patient experienced an allergic reaction to the Magnevist (hives). The patient was taken to the Emergency Room for observation on 06/12/2000 during MRI Brain [facility]   Simvastatin     Elevated transaminases   Tamsulosin  Other (See Comments)   Night sweats, may use it in the future for acute stone episodes        Medication List        Accurate as of March 28, 2024 11:29 AM. If you have any questions, ask your nurse or doctor.          buPROPion  150 MG 12 hr tablet Commonly known as: Wellbutrin  SR 1 tablet daily for 3 days, then 1 tablet twice daily. Stop smoking 14 days after starting medication   gabapentin  300 MG  capsule Commonly known as: NEURONTIN  Take 1 capsule (300 mg total) by mouth 2 (two) times daily.   MEGARED OMEGA-3 KRILL OIL PO Take 1,000 mg by mouth 3 (three) times daily.   metoprolol  succinate 100 MG 24 hr tablet Commonly known as: TOPROL -XL TAKE 1 TABLET BY MOUTH ONCE DAILY (TAKE  WITH  OR  IMMEDIATELY  FOLLOWING  A  MEAL)   rosuvastatin  10 MG tablet Commonly known as: CRESTOR  Take 1 tablet (10 mg total) by mouth daily.   sildenafil  100 MG tablet Commonly known as: VIAGRA  TAKE 1 TABLET BY MOUTH ONCE DAILY AS NEEDED FOR ERECTILE DYSFUNCTION        Allergies:  Allergies  Allergen Reactions   Codeine Itching   Gadolinium Derivatives Hives    The patient experienced an allergic reaction to the Magnevist (hives). The patient was taken to the Emergency Room for observation on 06/12/2000 during MRI Brain [facility]   Simvastatin      Elevated transaminases   Tamsulosin  Other (See Comments)    Night sweats, may use it in the future for acute stone episodes    Family History: Family History  Problem Relation Age of Onset   Coronary artery disease Father    Cancer Father    Leukemia Father    Depression Brother    Heart attack Maternal Grandmother     Social History:  reports that he has been smoking  cigarettes. He uses smokeless tobacco. He reports current alcohol use. He reports that he does not use drugs.   Physical Exam: BP 128/79   Pulse 80   Ht 5' 8 (1.727 m)   Wt 231 lb (104.8 kg)   BMI 35.12 kg/m   Constitutional:  Alert and oriented, No acute distress. HEENT: [place] AT  Psychiatric: Normal mood and affect.   Pertinent Imaging: CT abdomen pelvis December 2024 personally reviewed and interpreted.  3 calculi left kidney, largest lower pole measuring ~5 mm; 2 calculi right kidney largest lower pole ~4 mm KUB today personally viewed interpreted.  5 mm calcification overlying the inferior portion left renal outline   Assessment & Plan:    1. Bilateral  nephrolithiasis Stable/asymptomatic 1 year follow up with KUB  2. Erectile dysfunction Rx tadalafil 20 mg sent to pharmacy    Geraline Knapp, MD  [facility] [address] [place], [place] [address] [phone]

## 2024-04-04 ENCOUNTER — Telehealth: Payer: Self-pay

## 2024-04-04 NOTE — Telephone Encounter (Signed)
 Copied from CRM [id number]. Topic: Clinical - Request for Lab/Test Order >> Apr 03, 2024  4:26 PM Devaughn RAMAN wrote: Reason for CRM: Tillman from [employer] called to inquire about an order placed for the patient. Tillman stated she can be responded to in the secure chat under [employer] Tillman stated NP Groce placed an order for the patient for a CT Chest lung screening and patient typically have them done once a year and it shows the patient had one done on 02/16/2024. Please follow up with her regarding this, attempted to contact [facility] not accepting calls at this time. Tillman was thankful and verbalized understanding.

## 2024-04-07 NOTE — Telephone Encounter (Signed)
 Direct chat sent to [employer] advising that per Sarah Groce, NP pt will be due for a 6 month follow up CT 06/2024.

## 2024-05-26 ENCOUNTER — Encounter: Payer: Self-pay | Admitting: Family Medicine

## 2024-05-26 ENCOUNTER — Ambulatory Visit: Admitting: Family Medicine

## 2024-05-26 VITALS — BP 139/71 | HR 74 | Resp 16 | Ht 68.0 in | Wt 226.7 lb

## 2024-05-26 DIAGNOSIS — M25552 Pain in left hip: Secondary | ICD-10-CM | POA: Diagnosis not present

## 2024-05-26 DIAGNOSIS — M792 Neuralgia and neuritis, unspecified: Secondary | ICD-10-CM | POA: Diagnosis not present

## 2024-05-26 DIAGNOSIS — F410 Panic disorder [episodic paroxysmal anxiety] without agoraphobia: Secondary | ICD-10-CM

## 2024-05-26 MED ORDER — BUSPIRONE HCL 10 MG PO TABS: ORAL_TABLET | ORAL | 1 refills | Status: AC

## 2024-05-27 LAB — PROTEIN ELECTROPHORESIS, SERUM, WITH REFLEX
A/G Ratio: 1.2 (ref 0.7–1.7)
Albumin ELP: 4.1 g/dL (ref 2.9–4.4)
Alpha 1: 0.2 g/dL (ref 0.0–0.4)
Alpha 2: 0.9 g/dL (ref 0.4–1.0)
Beta: 1.1 g/dL (ref 0.7–1.3)
Gamma Globulin: 1 g/dL (ref 0.4–1.8)
Globulin, Total: 3.3 g/dL (ref 2.2–3.9)
Total Protein: 7.4 g/dL (ref 6.0–8.5)

## 2024-05-27 LAB — CBC WITH DIFFERENTIAL/PLATELET
Basophils Absolute: 0.1 x10E3/uL (ref 0.0–0.2)
Basos: 1 %
EOS (ABSOLUTE): 0.3 x10E3/uL (ref 0.0–0.4)
Eos: 3 %
Hematocrit: 48 % (ref 37.5–51.0)
Hemoglobin: 15.8 g/dL (ref 13.0–17.7)
Immature Grans (Abs): 0.1 x10E3/uL (ref 0.0–0.1)
Immature Granulocytes: 1 %
Lymphocytes Absolute: 2.9 x10E3/uL (ref 0.7–3.1)
Lymphs: 29 %
MCH: 30.2 pg (ref 26.6–33.0)
MCHC: 32.9 g/dL (ref 31.5–35.7)
MCV: 92 fL (ref 79–97)
Monocytes Absolute: 0.7 x10E3/uL (ref 0.1–0.9)
Monocytes: 7 %
Neutrophils Absolute: 6.1 x10E3/uL (ref 1.4–7.0)
Neutrophils: 59 %
Platelets: 262 x10E3/uL (ref 150–450)
RBC: 5.24 x10E6/uL (ref 4.14–5.80)
RDW: 13.1 % (ref 11.6–15.4)
WBC: 10.1 x10E3/uL (ref 3.4–10.8)

## 2024-05-27 LAB — VITAMIN B12: Vitamin B-12: 423 pg/mL (ref 232–1245)

## 2024-05-28 ENCOUNTER — Ambulatory Visit: Payer: Self-pay | Admitting: Family Medicine

## 2024-06-05 ENCOUNTER — Telehealth: Payer: Self-pay

## 2024-06-05 NOTE — Telephone Encounter (Signed)
 Pt's insurance denied the 6 month follow up CT because the nodule is under 6 mm in diameter.    Would you like me to appeal this decision?

## 2024-06-05 NOTE — Telephone Encounter (Signed)
 James Henry - those codes are usually for the annual LCS.  The main code for this would be R91.1 so I do not believe it would make a difference other than to re-enforce the denial.  Here is the rationale from the insurance:

## 2024-06-06 NOTE — Telephone Encounter (Signed)
 Holly, could we try and appeal the decision and then go from there if denied again? Thanks!

## 2024-06-07 NOTE — Progress Notes (Signed)
 Established patient visit   Patient: James Henry   DOB: 1971-07-25   53 y.o. Male  MRN: [id number] Visit Date: 05/26/2024  Today's healthcare provider: Nancyann Perry, MD   Chief Complaint  Patient presents with   Numbness    Pt has been experiencing numbness entire chin ongoing 5 months. All day numbness when numbness occur   Weight Loss    March weighed 241. June weighed 231. Not been eating much   Hip Pain    L hip pain with sensitivity, L rib pain x 3 months    Subjective    Discussed the use of AI scribe software for clinical note transcription with the patient, who gave verbal consent to proceed.  History of Present Illness   James Henry is a 53 year old male who presents with numbness and tingling in the chin and facial area.  He has been experiencing numbness and tingling in his chin, sometimes extending to his face, neck, and scalp, since March 2025. The sensation is primarily tingling rather than numbness and mainly affects his chin. He has consulted an ENT, dentist, and neurologist, and has undergone ultrasounds of the head and neck, MRI of the head, neck, and brain, and a CT of the brain, all of which were unremarkable. He was previously prescribed carbamazepine, which he could not tolerate due to side effects, and has since returned to gabapentin .  He also experiences hip pain and pain in two ribs. He has not sought recent medical evaluation for his hip but had an MRI three years ago, which showed osteoarthritis and a labral tear. No recent imaging has been done for his hip pain.  He has lost weight from 241 pounds in March 2025 to 226 pounds currently. He attributes this to anxiety and irregular eating habits, often skipping breakfast and lunch, and only eating dinner. Despite this, he states his appetite is good.  No fevers, chills, sweats, or swelling under his chin, neck, or arms. He has a family history of multiple myeloma, as his  brother carries the gene for the condition.       Medications: Outpatient Medications Prior to Visit  Medication Sig   buPROPion  (WELLBUTRIN  SR) 150 MG 12 hr tablet 1 tablet daily for 3 days, then 1 tablet twice daily. Stop smoking 14 days after starting medication   gabapentin  (NEURONTIN ) 300 MG capsule Take 1 capsule (300 mg total) by mouth 2 (two) times daily.   MEGARED OMEGA-3 KRILL OIL PO Take 1,000 mg by mouth 3 (three) times daily.   metoprolol  succinate (TOPROL -XL) 100 MG 24 hr tablet TAKE 1 TABLET BY MOUTH ONCE DAILY (TAKE  WITH  OR  IMMEDIATELY  FOLLOWING  A  MEAL)   pregabalin (LYRICA) 75 MG capsule Take 75 mg by mouth 3 (three) times daily.   rosuvastatin  (CRESTOR ) 10 MG tablet Take 1 tablet (10 mg total) by mouth daily.   sildenafil  (VIAGRA ) 100 MG tablet TAKE 1 TABLET BY MOUTH ONCE DAILY AS NEEDED FOR ERECTILE DYSFUNCTION   tadalafil  (CIALIS ) 20 MG tablet Take 1 tab 1 hour prior to intercourse   carbamazepine (TEGRETOL) 100 MG chewable tablet Take 100 mg twice daily for one week and then increase to 200 mg twice daily for facial tingling. (Patient not taking: Reported on 05/26/2024)   No facility-administered medications prior to visit.       Objective    BP 139/71 (BP Location: Left Arm, Patient Position: Sitting, Cuff Size: Large)  Pulse 74   Resp 16   Ht 5' 8 (1.727 m)   Wt 226 lb 11.2 oz (102.8 kg)   SpO2 98%   BMI 34.47 kg/m   Physical Exam   General: Appearance:    Obese male in no acute distress  Eyes:    PERRL, conjunctiva/corneas clear, EOM's intact       Lungs:     Clear to auscultation bilaterally, respirations unlabored  Heart:    Normal heart rate. Normal rhythm. No murmurs, rubs, or gallops.    MS:   All extremities are intact.    Neurologic:   Awake, alert, oriented x 3. No apparent focal neurological defect.          Assessment & Plan        Numbness and tingling of chin and face with associated pain in hip, possibly bone pain and  unintentional weight loss, evaluation for hematologic malignancy Intermittent tingling in chin, face, neck, and scalp with hip and rib pain, and weight loss. Differential includes hematologic malignancies and multiple myeloma due to family history. Previous imaging and specialist evaluations inconclusive. Further evaluation warranted. - Order blood tests to rule out hematologic malignancies. - Order serum electrophoresis to evaluate for multiple myeloma. - Check vitamin B12 levels.  Trigeminal neuralgia Diagnosed by neurologist. Symptoms include tingling in chin and face, extending to neck and scalp. Previous treatment with carbamazepine not tolerated; currently on gabapentin .  Anxiety Potentially contributing to weight loss due to decreased appetite and meal skipping.Start buspirone          Nancyann Perry, MD  [facility] [phone] (phone) [phone] (fax)  [facility]

## 2024-06-10 NOTE — Telephone Encounter (Signed)
 Sending appeal via fax:

## 2024-06-12 DIAGNOSIS — R202 Paresthesia of skin: Secondary | ICD-10-CM | POA: Diagnosis not present

## 2024-06-12 DIAGNOSIS — G500 Trigeminal neuralgia: Secondary | ICD-10-CM | POA: Diagnosis not present

## 2024-06-12 DIAGNOSIS — F411 Generalized anxiety disorder: Secondary | ICD-10-CM | POA: Diagnosis not present

## 2024-06-12 DIAGNOSIS — M542 Cervicalgia: Secondary | ICD-10-CM | POA: Diagnosis not present

## 2024-06-13 NOTE — Telephone Encounter (Signed)
No current update at this time

## 2024-06-18 ENCOUNTER — Inpatient Hospital Stay: Admission: RE | Admit: 2024-06-18 | Source: Ambulatory Visit

## 2024-06-18 ENCOUNTER — Ambulatory Visit: Admission: RE | Admit: 2024-06-18 | Source: Ambulatory Visit

## 2024-06-19 ENCOUNTER — Ambulatory Visit
Admission: RE | Admit: 2024-06-19 | Discharge: 2024-06-19 | Disposition: A | Discharge status: Home / Self Care | Source: Ambulatory Visit | Attending: Acute Care | Admitting: Acute Care

## 2024-06-19 DIAGNOSIS — F17200 Nicotine dependence, unspecified, uncomplicated: Secondary | ICD-10-CM | POA: Diagnosis not present

## 2024-06-19 DIAGNOSIS — R911 Solitary pulmonary nodule: Secondary | ICD-10-CM | POA: Insufficient documentation

## 2024-06-19 NOTE — Telephone Encounter (Signed)
This was authorized.

## 2024-07-01 ENCOUNTER — Emergency Department

## 2024-07-01 ENCOUNTER — Emergency Department
Admission: EM | Admit: 2024-07-01 | Discharge: 2024-07-01 | Disposition: A | Discharge status: Home / Self Care | Attending: Emergency Medicine | Admitting: Emergency Medicine

## 2024-07-01 ENCOUNTER — Other Ambulatory Visit: Payer: Self-pay

## 2024-07-01 DIAGNOSIS — R1013 Epigastric pain: Secondary | ICD-10-CM | POA: Insufficient documentation

## 2024-07-01 DIAGNOSIS — R1032 Left lower quadrant pain: Secondary | ICD-10-CM | POA: Insufficient documentation

## 2024-07-01 DIAGNOSIS — K5730 Diverticulosis of large intestine without perforation or abscess without bleeding: Secondary | ICD-10-CM | POA: Diagnosis not present

## 2024-07-01 DIAGNOSIS — I10 Essential (primary) hypertension: Secondary | ICD-10-CM | POA: Diagnosis not present

## 2024-07-01 DIAGNOSIS — R197 Diarrhea, unspecified: Secondary | ICD-10-CM | POA: Insufficient documentation

## 2024-07-01 DIAGNOSIS — N200 Calculus of kidney: Secondary | ICD-10-CM | POA: Diagnosis not present

## 2024-07-01 HISTORY — DX: Prediabetes: R73.03

## 2024-07-01 LAB — COMPREHENSIVE METABOLIC PANEL WITH GFR
ALT: 22 U/L (ref 0–44)
AST: 21 U/L (ref 15–41)
Albumin: 4.1 g/dL (ref 3.5–5.0)
Alkaline Phosphatase: 61 U/L (ref 38–126)
Anion gap: 11 (ref 5–15)
BUN: 10 mg/dL (ref 6–20)
CO2: 23 mmol/L (ref 22–32)
Calcium: 9.1 mg/dL (ref 8.9–10.3)
Chloride: 105 mmol/L (ref 98–111)
Creatinine, Ser: 0.61 mg/dL (ref 0.61–1.24)
GFR, Estimated: >60 mL/min (ref >60)
Glucose, Bld: 103 mg/dL — ABNORMAL HIGH (ref 70–99)
Potassium: 3.7 mmol/L (ref 3.5–5.1)
Sodium: 139 mmol/L (ref 135–145)
Total Bilirubin: 0.6 mg/dL (ref 0.0–1.2)
Total Protein: 7.2 g/dL (ref 6.5–8.1)

## 2024-07-01 LAB — CBC
HCT: 53.7 % — ABNORMAL HIGH (ref 39.0–52.0)
Hemoglobin: 17.2 g/dL — ABNORMAL HIGH (ref 13.0–17.0)
MCH: 29.5 pg (ref 26.0–34.0)
MCHC: 32 g/dL (ref 30.0–36.0)
MCV: 92 fL (ref 80.0–100.0)
Platelets: 249 K/uL (ref 150–400)
RBC: 5.84 MIL/uL — ABNORMAL HIGH (ref 4.22–5.81)
RDW: 12.6 % (ref 11.5–15.5)
WBC: 8.9 K/uL (ref 4.0–10.5)
nRBC: 0 % (ref 0.0–0.2)

## 2024-07-01 LAB — LIPASE, BLOOD: Lipase: 26 U/L (ref 11–51)

## 2024-07-01 MED ORDER — FAMOTIDINE IN NACL 20-0.9 MG/50ML-% IV SOLN
20.0000 mg | Freq: Once | INTRAVENOUS | Status: AC
  Administered 2024-07-01: 20 mg via INTRAVENOUS
  Filled 2024-07-01: qty 50

## 2024-07-01 MED ORDER — KETOROLAC TROMETHAMINE 15 MG/ML IJ SOLN
15.0000 mg | Freq: Once | INTRAMUSCULAR | Status: AC
  Administered 2024-07-01: 15 mg via INTRAVENOUS
  Filled 2024-07-01: qty 1

## 2024-07-01 MED ORDER — IOHEXOL 300 MG/ML  SOLN
100.0000 mL | Freq: Once | INTRAMUSCULAR | Status: AC | PRN
  Administered 2024-07-01: 100 mL via INTRAVENOUS

## 2024-07-01 NOTE — ED Notes (Signed)
 Patient to CT.

## 2024-07-01 NOTE — ED Notes (Signed)
 ED Provider at bedside.

## 2024-07-01 NOTE — ED Triage Notes (Signed)
 Pt to ED for LUQ abdominal pain since 2 months. Sometimes burning, sometimes dull and constant. States in the past has had gastritis. Also has been having diarrhea for a over a month. Takes ibuprofen each night with food. Hx heavy binge drinking, no drinking since 30 days. Pt in NAD, skin dry.

## 2024-07-01 NOTE — ED Provider Notes (Signed)
 [facility] Provider Note    Event Date/Time   First MD Initiated Contact with Patient 07/01/24 0920     (approximate)   History   Abdominal Pain   HPI  James Henry is a 53 y.o. male past medical history significant for hypertension, hyperlipidemia, GERD, diverticulosis, who presents to the emergency department for diarrhea and abdominal pain.  States that he has been having ongoing diarrhea for the past 1 month.  States that he has approximately 3-4 episodes of diarrhea on a daily basis.  Denies any melena or blood in his stool.  Complaining of left lower abdominal pain.  States that it is a constant pain that is nonradiating.  States that it has been ongoing for the past 1 month.  Does endorse a significant weight loss over the past month.  States that he just has not brought it up with his primary care doctor because he has had other things going on.  Does state that he has had a history of kidney stones and does not believe that this is his kidney stones.  Denies dysuria, urinary urgency or frequency.  Also complaining of some mild epigastric abdominal pain.  States his last drink of alcohol was 1 month ago.  Does state that he has a history of binge drinking.  History of diverticulosis but no prior episodes of diverticulitis.  States that he has been taking Tylenol  threes which he had leftover from a prior kidney stone with some improvement of his pain.  No recent travel.  History of colonoscopy, states that he is scheduled to have another 1 this coming year.     Physical Exam   Triage Vital Signs: ED Triage Vitals  Encounter Vitals Group     BP 07/01/24 0912 (!) 151/86     Girls Systolic BP Percentile --      Girls Diastolic BP Percentile --      Boys Systolic BP Percentile --      Boys Diastolic BP Percentile --      Pulse Rate 07/01/24 0912 70     Resp 07/01/24 0912 20     Temp 07/01/24 0912 98 F (36.7 C)     Temp Source 07/01/24 0912  Oral     SpO2 07/01/24 0912 100 %     Weight 07/01/24 0915 217 lb (98.4 kg)     Height 07/01/24 0915 5' 8 (1.727 m)     Head Circumference --      Peak Flow --      Pain Score 07/01/24 0914 7     Pain Loc --      Pain Education --      Exclude from Growth Chart --     Most recent vital signs: Vitals:   07/01/24 0912 07/01/24 1245  BP: (!) 151/86 133/76  Pulse: 70 (!) 51  Resp: 20 16  Temp: 98 F (36.7 C) 98.5 F (36.9 C)  SpO2: 100% 99%    Physical Exam Constitutional:      Appearance: He is well-developed.  HENT:     Head: Atraumatic.  Eyes:     Conjunctiva/sclera: Conjunctivae normal.  Cardiovascular:     Rate and Rhythm: Regular rhythm.  Pulmonary:     Effort: No respiratory distress.  Abdominal:     General: Abdomen is flat.     Tenderness: There is abdominal tenderness in the left lower quadrant.     Comments: Mild left lower quadrant abdominal tenderness to palpation  with no rebound or guarding.  Musculoskeletal:     Cervical back: Normal range of motion.  Skin:    General: Skin is warm.     Capillary Refill: Capillary refill takes less than 2 seconds.  Neurological:     General: No focal deficit present.     Mental Status: He is alert. Mental status is at baseline.     IMPRESSION / MDM / ASSESSMENT AND PLAN / ED COURSE  I reviewed the triage vital signs and the nursing notes.  Differential diagnosis including enteritis, diverticulitis, intra-abdominal abscess.  Lower suspicion for C. difficile, no recent antibiotic use.  Lower abdominal pain that has been ongoing for the past 1 month, have a lower suspicion for ACS.  Afebrile and hemodynamically stable.  Plan for lab work and CT imaging with contrast   EKG  I, Clotilda Punter, the attending physician, personally viewed and interpreted this ECG.   Rate: Normal  Rhythm: Normal sinus  Axis: Normal  Intervals: Normal  ST&T Change: None  No tachycardic or bradycardic dysrhythmias while on cardiac  telemetry.  RADIOLOGY I independently reviewed imaging, my interpretation of imaging: CT scan abdomen and pelvis with contrast -no acute findings.  Read as diverticulosis but no findings of acute diverticulitis.  Nonobstructing nephrolithiasis and signs of esophagitis.  LABS (all labs ordered are listed, but only abnormal results are displayed) Labs interpreted as -    Labs Reviewed  COMPREHENSIVE METABOLIC PANEL WITH GFR - Abnormal; Notable for the following components:      Result Value   Glucose, Bld 103 (*)    All other components within normal limits  CBC - Abnormal; Notable for the following components:   RBC 5.84 (*)    Hemoglobin 17.2 (*)    HCT 53.7 (*)    All other components within normal limits  GASTROINTESTINAL PANEL BY PCR, STOOL (REPLACES STOOL CULTURE)  C DIFFICILE QUICK SCREEN W PCR REFLEX    URINALYSIS, ROUTINE W REFLEX MICROSCOPIC  LIPASE, BLOOD     MDM  Patient unable to provide a stool sample in the emergency department, have a low suspicion for infectious source of his diarrhea or C. difficile.  Lab work overall reassuring.  Patient CT scan without findings to explain his left lower abdominal pain.  Patient had a recent endoscopy and biopsy and did not show any signs of malignancy.  Restarted his PPI this week.  Encouraged to follow-up closely as an outpatient with primary care physician and discussed return precautions for any ongoing or worsening symptoms.  No questions at time of discharge.     PROCEDURES:  Critical Care performed: No  Procedures  Patient's presentation is most consistent with acute presentation with potential threat to life or bodily function.   MEDICATIONS ORDERED IN ED: Medications  ketorolac  (TORADOL ) 15 MG/ML injection 15 mg (15 mg Intravenous Given 07/01/24 0952)  famotidine  (PEPCID ) IVPB 20 mg premix (0 mg Intravenous Stopped 07/01/24 1103)  iohexol  (OMNIPAQUE ) 300 MG/ML solution 100 mL (100 mLs Intravenous Contrast Given  07/01/24 1142)    FINAL CLINICAL IMPRESSION(S) / ED DIAGNOSES   Final diagnoses:  Left lower quadrant abdominal pain  Diarrhea, unspecified type     Rx / DC Orders   ED Discharge Orders     None        Note:  This document was prepared using Dragon voice recognition software and may include unintentional dictation errors.   Punter Clotilda, MD 07/01/24 1256

## 2024-07-01 NOTE — Discharge Instructions (Addendum)
 You were seen in the emergency department for lower abdominal pain.  You had a CT scan that did not show any abnormalities to explain your left lower abdominal pain today.  You did have findings of diverticulosis but no findings of an infection.  Your lab work was overall normal.  Follow-up closely with your primary care physician.  Return to the emergency department for any worsening symptoms.  You had findings on your CT scan of inflammation to your esophagus, continue to take your acid reducing medication as prescribed.

## 2024-07-04 DIAGNOSIS — R1084 Generalized abdominal pain: Secondary | ICD-10-CM | POA: Diagnosis not present

## 2024-07-04 DIAGNOSIS — R194 Change in bowel habit: Secondary | ICD-10-CM | POA: Diagnosis not present

## 2024-07-04 DIAGNOSIS — R197 Diarrhea, unspecified: Secondary | ICD-10-CM | POA: Diagnosis not present

## 2024-07-07 ENCOUNTER — Other Ambulatory Visit: Payer: Self-pay

## 2024-07-07 DIAGNOSIS — Z122 Encounter for screening for malignant neoplasm of respiratory organs: Secondary | ICD-10-CM

## 2024-07-07 DIAGNOSIS — F17210 Nicotine dependence, cigarettes, uncomplicated: Secondary | ICD-10-CM

## 2024-07-07 DIAGNOSIS — Z87891 Personal history of nicotine dependence: Secondary | ICD-10-CM

## 2024-07-20 ENCOUNTER — Other Ambulatory Visit: Payer: Self-pay | Admitting: Urology

## 2024-07-20 ENCOUNTER — Other Ambulatory Visit: Payer: Self-pay | Admitting: Family Medicine

## 2024-07-20 DIAGNOSIS — E782 Mixed hyperlipidemia: Secondary | ICD-10-CM

## 2024-08-11 ENCOUNTER — Ambulatory Visit

## 2024-08-11 DIAGNOSIS — K5730 Diverticulosis of large intestine without perforation or abscess without bleeding: Secondary | ICD-10-CM | POA: Diagnosis not present

## 2024-08-11 DIAGNOSIS — D126 Benign neoplasm of colon, unspecified: Secondary | ICD-10-CM | POA: Diagnosis not present

## 2024-08-11 DIAGNOSIS — K2970 Gastritis, unspecified, without bleeding: Secondary | ICD-10-CM | POA: Diagnosis not present

## 2024-08-11 DIAGNOSIS — R933 Abnormal findings on diagnostic imaging of other parts of digestive tract: Secondary | ICD-10-CM | POA: Diagnosis not present

## 2024-08-11 DIAGNOSIS — D125 Benign neoplasm of sigmoid colon: Secondary | ICD-10-CM | POA: Diagnosis not present

## 2024-08-11 DIAGNOSIS — K640 First degree hemorrhoids: Secondary | ICD-10-CM | POA: Diagnosis not present

## 2024-08-11 DIAGNOSIS — R1084 Generalized abdominal pain: Secondary | ICD-10-CM | POA: Diagnosis not present

## 2024-09-11 DIAGNOSIS — K2970 Gastritis, unspecified, without bleeding: Secondary | ICD-10-CM | POA: Diagnosis not present

## 2024-09-11 DIAGNOSIS — K580 Irritable bowel syndrome with diarrhea: Secondary | ICD-10-CM | POA: Diagnosis not present

## 2024-09-11 DIAGNOSIS — K219 Gastro-esophageal reflux disease without esophagitis: Secondary | ICD-10-CM | POA: Diagnosis not present

## 2024-10-12 ENCOUNTER — Other Ambulatory Visit: Payer: Self-pay | Admitting: Family Medicine

## 2024-10-12 DIAGNOSIS — I10 Essential (primary) hypertension: Secondary | ICD-10-CM

## 2025-03-27 ENCOUNTER — Other Ambulatory Visit

## 2025-03-30 ENCOUNTER — Ambulatory Visit: Admitting: Urology
